# Patient Record
Sex: Male | Born: 1960
Health system: Southern US, Community
[De-identification: ages and names within clinical notes are randomized; demographics above are authoritative.]

## PROBLEM LIST (undated history)

## (undated) HISTORY — PX: KNEE SURGERY: SHX244

---

## 1998-03-20 ENCOUNTER — Encounter: Admission: RE | Admit: 1998-03-20 | Discharge: 1998-03-20 | Payer: Self-pay | Admitting: Sports Medicine

## 1999-09-14 ENCOUNTER — Emergency Department (HOSPITAL_COMMUNITY): Admission: EM | Admit: 1999-09-14 | Discharge: 1999-09-14 | Payer: Self-pay | Admitting: Emergency Medicine

## 2006-01-31 ENCOUNTER — Encounter (INDEPENDENT_AMBULATORY_CARE_PROVIDER_SITE_OTHER): Payer: Self-pay | Admitting: *Deleted

## 2006-01-31 ENCOUNTER — Ambulatory Visit (HOSPITAL_BASED_OUTPATIENT_CLINIC_OR_DEPARTMENT_OTHER): Admission: RE | Admit: 2006-01-31 | Discharge: 2006-01-31 | Payer: Self-pay | Admitting: General Surgery

## 2006-07-24 ENCOUNTER — Ambulatory Visit (HOSPITAL_COMMUNITY): Admission: RE | Admit: 2006-07-24 | Discharge: 2006-07-24 | Payer: Self-pay | Admitting: Orthopaedic Surgery

## 2006-07-30 ENCOUNTER — Ambulatory Visit (HOSPITAL_BASED_OUTPATIENT_CLINIC_OR_DEPARTMENT_OTHER): Admission: RE | Admit: 2006-07-30 | Discharge: 2006-07-30 | Payer: Self-pay | Admitting: Orthopaedic Surgery

## 2008-11-08 ENCOUNTER — Ambulatory Visit (HOSPITAL_BASED_OUTPATIENT_CLINIC_OR_DEPARTMENT_OTHER): Admission: RE | Admit: 2008-11-08 | Discharge: 2008-11-08 | Payer: Self-pay | Admitting: Orthopaedic Surgery

## 2010-06-24 ENCOUNTER — Encounter: Payer: Self-pay | Admitting: *Deleted

## 2010-09-10 LAB — POCT HEMOGLOBIN-HEMACUE: Hemoglobin: 13.8 g/dL (ref 13.0–17.0)
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
## 2010-10-19 NOTE — Op Note (Signed)
NAME:  Benjamin Webster, Benjamin Webster             ACCOUNT NO.:  [id number]   MEDICAL RECORD NO.:  [id number]          PATIENT TYPE:  AMB   LOCATION:  DSC                          FACILITY:  [facility]   PHYSICIAN:  Vanita Panda. Magnus Ivan, M.D.DATE OF BIRTH:  [birth year]   DATE OF PROCEDURE:  07/30/2006  DATE OF DISCHARGE:                               OPERATIVE REPORT   PREOPERATIVE DIAGNOSIS:  Left knee complex medial meniscal tear.   POSTOPERATIVE DIAGNOSIS:  1. Left knee complex posterior horn medial meniscal tear.  2. Grade 3 chondromalacia medial femoral condyle.   PROCEDURE:  1. Left knee arthroscopy with debridement.  2. Left knee partial medial meniscectomy.   SURGEON:  Vanita Panda. Magnus Ivan, M.D.   ANESTHESIA:  1. Local knee block.  2. Mask ventilation with IV sedation.   BLOOD LOSS:  Minimal.   COMPLICATIONS:  None.   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  He is an avid runner and has been so for many years.  For over six weeks now, he developed left knee pain with an effusion and  medial sided joint line tenderness.  This was new to him and he had no  obvious injury, but again, he has years as an avid runner with many  miles run a week.  I saw him in the office and set him up for an MRI  scan. The MRI did come back with effusion of his knee as well as a  signal throughout the posterior horn of the medial meniscus into the mid  body suggesting a complex tear.  It was recommended that he undergo  arthroscopy with debridement and partial medial meniscectomy considering  the amount of synovitis in the knee and what this was doing for his knee  from a pain standpoint, as well.  The risks and benefits of this were  explained to him and well understood.  He agreed to proceed with  surgery.   PROCEDURE:  After informed consent was obtained the appropriate left leg  was marked, a knee block was obtained by anesthesia.  He was then  brought to the operating room and placed supine on the operating table.  The left leg was prepped and draped with DuraPrep and sterile drapes  including sterile stockinette.  A time-out was called and he was  identified as the correct patient and the correct extremity.  The bed  was raised and with a lateral leg post, his leg was flexed off the side  of the table.  Again, he had already been prepped and draped with  DuraPrep and sterile drapes and a sterile stockinette.  An anterolateral  portal was then made just off the lateral edge of the patellar tendon at  the level of the inferior pole of patella.  The arthroscope was inserted  and right away I went to the medial compartment and made a medial  incision for inserting a probe. Right away, I could see that there was a  cartilage defect on the articular surface of the medial femoral condyle  toward the notch. I probed  this and this was found to be at least grade  2 to grade 3 chondromalacia, but there was not exposed bone and margins  were at least free from any large fragment debris. With external  rotation and valgus stress on the knee, you could see a complex tear of  the posterior horn of the meniscus.  I then placed an arthroscopic  shaver into the knee as well as upbiters and side biters to perform a  partial medial meniscectomy.  Once the meniscus was debrided, I was able  to put a probe back in the knee and then the meniscus was found to have  some remnant of the posterior edge remaining with no free meniscal  fragment and no block or obstruction affecting the joint.  I did put the  knee through a range of motion with flexion and extension.  Next, the  ACL was probed and was found to be intact.  The lateral compartment was  assessed using the figure-of-four position.  The lateral meniscus, the  popliteus tendon, and the cartilage were all pristine and intact.  The  patellofemoral joint was also assessed and it was found to be  entirely  intact with no areas of cartilage deficit.  There was only some mild  synovitis in the knee, as well.  All instrumentation was removed and I  allowed fluid to lavage through the knee.  I then removed the fluid from  the knee and closed the portal sites with interrupted 4-0 nylon suture.  The knee was infiltrated with a morphine/Marcaine mixture.  Xeroform  followed by a well padded sterile dressing was applied.  The patient was  awakened and taken to the recovery room in stable condition.  There were  no complications.  All final counts were correct.   Postoperatively, I will have him rest his knee accordingly and  rehabilitated. I will have him start back on anti-inflammatories in a  few days postoperatively.  He will follow up in the office in two weeks.           ______________________________  Vanita Panda. Magnus Ivan, M.D.     CYB/MEDQ  D:  07/30/2006  T:  07/30/2006  Job:  [id number]

## 2012-03-02 ENCOUNTER — Ambulatory Visit (INDEPENDENT_AMBULATORY_CARE_PROVIDER_SITE_OTHER): Payer: Commercial Managed Care - PPO | Admitting: General Surgery

## 2012-03-02 DIAGNOSIS — D229 Melanocytic nevi, unspecified: Secondary | ICD-10-CM

## 2012-03-02 DIAGNOSIS — D239 Other benign neoplasm of skin, unspecified: Secondary | ICD-10-CM

## 2012-03-02 NOTE — Addendum Note (Signed)
Addended byLittie Deeds on: 03/02/2012 08:57 AM   Modules accepted: Orders

## 2012-03-02 NOTE — Progress Notes (Signed)
Subjective:     Patient ID: Benjamin Webster, male   DOB: [birth year], 51 y.o.   MRN: [id number]  HPI 51 year old healthy male who presents with a changing left arm nevus for biopsy.  Review of Systems     Objective:   Physical Exam Left forearm with a 2 mm black papule    Assessment:     Left forearm nevus    Plan:     We discussed a punch biopsy. I used a 6 mm punch biopsy. I cleansed this area. I then infiltrated lidocaine. I then did a 6 mm punch biopsy. I closed this with a 4-0 nylon suture. A dressing was placed. I will call him with his pathology results.

## 2012-03-12 ENCOUNTER — Other Ambulatory Visit: Payer: Self-pay

## 2014-01-06 ENCOUNTER — Emergency Department (HOSPITAL_COMMUNITY)
Admission: EM | Admit: 2014-01-06 | Discharge: 2014-01-06 | Disposition: A | Discharge status: Home / Self Care | Payer: 59 | Source: Home / Self Care | Attending: Family Medicine | Admitting: Family Medicine

## 2014-01-06 ENCOUNTER — Encounter (HOSPITAL_COMMUNITY): Payer: Self-pay | Admitting: Emergency Medicine

## 2014-01-06 DIAGNOSIS — J029 Acute pharyngitis, unspecified: Secondary | ICD-10-CM

## 2014-01-06 LAB — POCT RAPID STREP A: Streptococcus, Group A Screen (Direct): NEGATIVE

## 2014-01-06 MED ORDER — IPRATROPIUM BROMIDE 0.06 % NA SOLN: 2.0000 | Freq: Four times a day (QID) | NASAL | Status: DC

## 2014-01-06 NOTE — ED Provider Notes (Signed)
Benjamin Webster is a 53 y.o. male who presents to Urgent Care today for sore throat. Patient has a three-day history of sore throat associated with fatigue and a postnasal drip. He has tried some Alka-Seltzer which helps some. He has a mild cough as well. He denies any fevers chills nausea vomiting or diarrhea. He feels well otherwise.   History reviewed. No pertinent past medical history. History  Substance Use Topics  . Smoking status: Never Smoker   . Smokeless tobacco: Not on file  . Alcohol Use: No   ROS as above Medications: No current facility-administered medications for this encounter.   Current Outpatient Prescriptions  Medication Sig Dispense Refill  . ipratropium (ATROVENT) 0.06 % nasal spray Place 2 sprays into both nostrils 4 (four) times daily.  15 mL  1    Exam:  BP 128/80  Pulse 97  Temp(Src) 98.4 F (36.9 C) (Oral)  Resp 18  SpO2 100% Gen: Well NAD HEENT: EOMI,  MMM cobblestoning posterior pharynx. Tympanic membranes are normal appearing bilaterally. Lungs: Normal work of breathing. CTABL Heart: RRR no MRG Abd: NABS, Soft. Nondistended, Nontender Exts: Brisk capillary refill, warm and well perfused.   Results for orders placed during the hospital encounter of 01/06/14 (from the past 24 hour(s))  POCT RAPID STREP A ([place])     Status: None   Collection Time    01/06/14  9:15 AM      Result Value Ref Range   Streptococcus, Group A Screen (Direct) NEGATIVE  NEGATIVE   No results found.  Assessment and Plan: 53 y.o. male with pharyngitis. Likely viral. Strep culture pending. Treatment with Tylenol or ibuprofen as well as Atrovent nasal spray.  Discussed warning signs or symptoms. Please see discharge instructions. Patient expresses understanding.   This note was created using Systems analyst. Any transcription errors are unintended.    Gregor Hams, MD 01/06/14 [phone]

## 2014-01-06 NOTE — ED Notes (Signed)
C/o  Sore throat x 3 days.  Afebrile.  Denies any other symptoms.

## 2014-01-06 NOTE — Discharge Instructions (Signed)
Thank you for coming in today. °Call or go to the emergency room if you get worse, have trouble breathing, have chest pains, or palpitations.  ° °Pharyngitis °Pharyngitis is redness, pain, and swelling (inflammation) of your pharynx.  °CAUSES  °Pharyngitis is usually caused by infection. Most of the time, these infections are from viruses (viral) and are part of a cold. However, sometimes pharyngitis is caused by bacteria (bacterial). Pharyngitis can also be caused by allergies. Viral pharyngitis may be spread from person to person by coughing, sneezing, and personal items or utensils (cups, forks, spoons, toothbrushes). Bacterial pharyngitis may be spread from person to person by more intimate contact, such as kissing.  °SIGNS AND SYMPTOMS  °Symptoms of pharyngitis include:   °· Sore throat.   °· Tiredness (fatigue).   °· Low-grade fever.   °· Headache. °· Joint pain and muscle aches. °· Skin rashes. °· Swollen lymph nodes. °· Plaque-like film on throat or tonsils (often seen with bacterial pharyngitis). °DIAGNOSIS  °Your health care provider will ask you questions about your illness and your symptoms. Your medical history, along with a physical exam, is often all that is needed to diagnose pharyngitis. Sometimes, a rapid strep test is done. Other lab tests may also be done, depending on the suspected cause.  °TREATMENT  °Viral pharyngitis will usually get better in 3-4 days without the use of medicine. Bacterial pharyngitis is treated with medicines that kill germs (antibiotics).  °HOME CARE INSTRUCTIONS  °· Drink enough water and fluids to keep your urine clear or pale yellow.   °· Only take over-the-counter or prescription medicines as directed by your health care provider:   °¨ If you are prescribed antibiotics, make sure you finish them even if you start to feel better.   °¨ Do not take aspirin.   °· Get lots of rest.   °· Gargle with 8 oz of salt water (½ tsp of salt per 1 qt of water) as often as every 1-2  hours to soothe your throat.   °· Throat lozenges (if you are not at risk for choking) or sprays may be used to soothe your throat. °SEEK MEDICAL CARE IF:  °· You have large, tender lumps in your neck. °· You have a rash. °· You cough up green, yellow-brown, or bloody spit. °SEEK IMMEDIATE MEDICAL CARE IF:  °· Your neck becomes stiff. °· You drool or are unable to swallow liquids. °· You vomit or are unable to keep medicines or liquids down. °· You have severe pain that does not go away with the use of recommended medicines. °· You have trouble breathing (not caused by a stuffy nose). °MAKE SURE YOU:  °· Understand these instructions. °· Will watch your condition. °· Will get help right away if you are not doing well or get worse. °Document Released: 05/20/2005 Document Revised: 03/10/2013 Document Reviewed: 01/25/2013 °[employer]® Patient Information ©2015 [employer]. This information is not intended to replace advice given to you by your health care provider. Make sure you discuss any questions you have with your health care provider. ° ° ° °

## 2014-01-08 LAB — CULTURE, GROUP A STREP

## 2015-01-11 ENCOUNTER — Telehealth: Payer: Self-pay | Admitting: Family Medicine

## 2015-01-11 NOTE — Telephone Encounter (Signed)
Lm on vm to cb and OK to sch appt w/ dr Elease Hashimoto

## 2015-01-12 NOTE — Telephone Encounter (Signed)
Pt has been scheduled.  °

## 2015-02-09 ENCOUNTER — Ambulatory Visit (INDEPENDENT_AMBULATORY_CARE_PROVIDER_SITE_OTHER): Payer: 59 | Admitting: Family Medicine

## 2015-02-09 ENCOUNTER — Encounter: Payer: Self-pay | Admitting: Family Medicine

## 2015-02-09 VITALS — BP 122/84 | HR 95 | Temp 97.9°F | Ht 67.72 in | Wt 167.0 lb

## 2015-02-09 DIAGNOSIS — Z0000 Encounter for general adult medical examination without abnormal findings: Secondary | ICD-10-CM | POA: Diagnosis not present

## 2015-02-09 DIAGNOSIS — Z23 Encounter for immunization: Secondary | ICD-10-CM

## 2015-02-09 NOTE — Progress Notes (Signed)
   Subjective:    Patient ID: Benjamin Webster, male    DOB: [birth year], 54 y.o.   MRN: [id number]  HPI Patient seen to establish care and for CPE. He has not seen a regular physician in several years. Generally very healthy. He takes no regular medications. He has not had screening colonoscopy. No flu vaccine yet. Nonsmoker. Works as a Music therapist. Exercises somewhat inconsistently. Married with 4 children.    History reviewed. No pertinent past medical history. Past Surgical History  Procedure Laterality Date  . Knee surgery      reports that he has never smoked. He does not have any smokeless tobacco history on file. He reports that he does not drink alcohol or use illicit drugs. family history includes Cancer in his mother; Heart disease in his father; Hyperlipidemia in his mother; Hypertension in his mother. No Known Allergies    Review of Systems  Constitutional: Negative for fever, activity change, appetite change and fatigue.  HENT: Negative for congestion, ear pain and trouble swallowing.   Eyes: Negative for pain and visual disturbance.  Respiratory: Negative for cough, shortness of breath and wheezing.   Cardiovascular: Negative for chest pain and palpitations.  Gastrointestinal: Negative for nausea, vomiting, abdominal pain, diarrhea, constipation, blood in stool, abdominal distention and rectal pain.  Genitourinary: Negative for dysuria, hematuria and testicular pain.  Musculoskeletal: Negative for joint swelling and arthralgias.  Skin: Negative for rash.  Neurological: Negative for dizziness, syncope and headaches.  Hematological: Negative for adenopathy.  Psychiatric/Behavioral: Negative for confusion and dysphoric mood.       Objective:   Physical Exam  Constitutional: He is oriented to person, place, and time. He appears well-developed and well-nourished. No distress.  HENT:  Head: Normocephalic and atraumatic.  Right Ear: External ear normal.    Left Ear: External ear normal.  Mouth/Throat: Oropharynx is clear and moist.  Eyes: Conjunctivae and EOM are normal. Pupils are equal, round, and reactive to light.  Neck: Normal range of motion. Neck supple. No thyromegaly present.  Cardiovascular: Normal rate, regular rhythm and normal heart sounds.   No murmur heard. Pulmonary/Chest: No respiratory distress. He has no wheezes. He has no rales.  Abdominal: Soft. Bowel sounds are normal. He exhibits no distension and no mass. There is no tenderness. There is no rebound and no guarding.  Genitourinary: Rectum normal and prostate normal.  Musculoskeletal: He exhibits no edema.  Lymphadenopathy:    He has no cervical adenopathy.  Neurological: He is alert and oriented to person, place, and time. He displays normal reflexes. No cranial nerve deficit.  Skin: No rash noted.  Psychiatric: He has a normal mood and affect.          Assessment & Plan:  Physical exam. Schedule screening colonoscopy. Flu vaccine given. Schedule screening lab work. Confirm date of last tetanus.

## 2015-02-09 NOTE — Progress Notes (Signed)
Pre visit review using our clinic review tool, if applicable. No additional management support is needed unless otherwise documented below in the visit note. 

## 2015-02-10 LAB — BASIC METABOLIC PANEL
BUN: 16 mg/dL (ref 6–23)
CHLORIDE: 103 meq/L (ref 96–112)
CO2: 28 mEq/L (ref 19–32)
CREATININE: 1.01 mg/dL (ref 0.40–1.50)
Calcium: 9.5 mg/dL (ref 8.4–10.5)
GFR: 81.1 mL/min (ref >60.00)
Glucose, Bld: 74 mg/dL (ref 70–99)
Potassium: 4.6 mEq/L (ref 3.5–5.1)
SODIUM: 140 meq/L (ref 135–145)

## 2015-02-10 LAB — PSA: PSA: 0.55 ng/mL (ref 0.10–4.00)

## 2015-02-10 LAB — CBC WITH DIFFERENTIAL/PLATELET
Basophils Absolute: 0.1 10*3/uL (ref 0.0–0.1)
Basophils Relative: 1 % (ref 0.0–3.0)
EOS ABS: 0.2 10*3/uL (ref 0.0–0.7)
Eosinophils Relative: 2.7 % (ref 0.0–5.0)
HCT: 47 % (ref 39.0–52.0)
Hemoglobin: 16.3 g/dL (ref 13.0–17.0)
Lymphocytes Relative: 26.6 % (ref 12.0–46.0)
Lymphs Abs: 2.2 10*3/uL (ref 0.7–4.0)
MCHC: 34.7 g/dL (ref 30.0–36.0)
MCV: 87.1 fl (ref 78.0–100.0)
MONO ABS: 0.6 10*3/uL (ref 0.1–1.0)
Monocytes Relative: 7.3 % (ref 3.0–12.0)
NEUTROS ABS: 5.1 10*3/uL (ref 1.4–7.7)
Neutrophils Relative %: 62.4 % (ref 43.0–77.0)
PLATELETS: 266 10*3/uL (ref 150.0–400.0)
RBC: 5.4 Mil/uL (ref 4.22–5.81)
RDW: 13.3 % (ref 11.5–15.5)
WBC: 8.1 10*3/uL (ref 4.0–10.5)

## 2015-02-10 LAB — HEPATIC FUNCTION PANEL
ALT: 29 U/L (ref 0–53)
AST: 23 U/L (ref 0–37)
Albumin: 4.5 g/dL (ref 3.5–5.2)
Alkaline Phosphatase: 56 U/L (ref 39–117)
Bilirubin, Direct: 0.1 mg/dL (ref 0.0–0.3)
TOTAL PROTEIN: 6.8 g/dL (ref 6.0–8.3)
Total Bilirubin: 0.7 mg/dL (ref 0.2–1.2)

## 2015-02-10 LAB — LIPID PANEL
CHOL/HDL RATIO: 5
Cholesterol: 205 mg/dL — ABNORMAL HIGH (ref 0–200)
HDL: 38.1 mg/dL — AB (ref >39.00)
LDL CALC: 140 mg/dL — AB (ref 0–99)
NonHDL: 167.27
TRIGLYCERIDES: 134 mg/dL (ref 0.0–149.0)
VLDL: 26.8 mg/dL (ref 0.0–40.0)

## 2015-02-10 LAB — TSH: TSH: 3.69 u[IU]/mL (ref 0.35–4.50)

## 2015-02-13 ENCOUNTER — Encounter: Payer: Self-pay | Admitting: Family Medicine

## 2015-05-05 ENCOUNTER — Telehealth: Payer: Self-pay | Admitting: Family Medicine

## 2015-05-05 MED ORDER — AZITHROMYCIN 250 MG PO TABS: ORAL_TABLET | ORAL | Status: AC

## 2015-05-05 NOTE — Telephone Encounter (Signed)
Pt c/o progressive sinus congestion, colored nasal discharge, malaise, not improved with OTC meds for several days.  Agreed to calling in Z-max and if no better in one week needs office follow up.  He has not had any recent antibiotics and states only about 4 times in his life.  No fever. No stiff neck.

## 2016-03-21 DIAGNOSIS — L821 Other seborrheic keratosis: Secondary | ICD-10-CM | POA: Diagnosis not present

## 2016-03-21 DIAGNOSIS — B351 Tinea unguium: Secondary | ICD-10-CM | POA: Diagnosis not present

## 2016-03-21 DIAGNOSIS — D225 Melanocytic nevi of trunk: Secondary | ICD-10-CM | POA: Diagnosis not present

## 2016-03-21 DIAGNOSIS — D1801 Hemangioma of skin and subcutaneous tissue: Secondary | ICD-10-CM | POA: Diagnosis not present

## 2016-03-21 DIAGNOSIS — D2271 Melanocytic nevi of right lower limb, including hip: Secondary | ICD-10-CM | POA: Diagnosis not present

## 2016-03-21 DIAGNOSIS — L814 Other melanin hyperpigmentation: Secondary | ICD-10-CM | POA: Diagnosis not present

## 2016-03-21 DIAGNOSIS — D2262 Melanocytic nevi of left upper limb, including shoulder: Secondary | ICD-10-CM | POA: Diagnosis not present

## 2016-03-21 DIAGNOSIS — L918 Other hypertrophic disorders of the skin: Secondary | ICD-10-CM | POA: Diagnosis not present

## 2016-03-21 DIAGNOSIS — L570 Actinic keratosis: Secondary | ICD-10-CM | POA: Diagnosis not present

## 2016-04-11 ENCOUNTER — Other Ambulatory Visit: Payer: Self-pay | Admitting: General Surgery

## 2016-04-11 MED ORDER — AZITHROMYCIN 250 MG PO TABS: ORAL_TABLET | ORAL | 0 refills | Status: AC

## 2016-04-11 MED FILL — AZITHROMYCIN 250 MG TABLET: 250 | 5 days supply | Qty: 6 | Fill #0

## 2016-08-02 ENCOUNTER — Other Ambulatory Visit (HOSPITAL_COMMUNITY): Payer: Self-pay | Admitting: General Surgery

## 2016-08-02 DIAGNOSIS — K4090 Unilateral inguinal hernia, without obstruction or gangrene, not specified as recurrent: Secondary | ICD-10-CM

## 2016-08-07 ENCOUNTER — Ambulatory Visit (HOSPITAL_COMMUNITY)
Admission: RE | Admit: 2016-08-07 | Discharge: 2016-08-07 | Disposition: A | Discharge status: Home / Self Care | Payer: 59 | Source: Ambulatory Visit | Attending: General Surgery | Admitting: General Surgery

## 2016-08-07 DIAGNOSIS — I771 Stricture of artery: Secondary | ICD-10-CM | POA: Insufficient documentation

## 2016-08-07 DIAGNOSIS — K4090 Unilateral inguinal hernia, without obstruction or gangrene, not specified as recurrent: Secondary | ICD-10-CM | POA: Diagnosis not present

## 2016-08-07 DIAGNOSIS — R1031 Right lower quadrant pain: Secondary | ICD-10-CM | POA: Diagnosis present

## 2016-08-07 MED ORDER — IOPAMIDOL (ISOVUE-300) INJECTION 61%
INTRAVENOUS | Status: AC
  Administered 2016-08-07: 100 mL
  Filled 2016-08-07: qty 100

## 2016-10-04 MED FILL — AZITHROMYCIN 250 MG TABLET: 250 | 5 days supply | Qty: 6 | Fill #0

## 2016-12-12 ENCOUNTER — Telehealth: Payer: Self-pay | Admitting: Family Medicine

## 2016-12-12 MED ORDER — AZITHROMYCIN 250 MG PO TABS: ORAL_TABLET | ORAL | 0 refills | Status: AC

## 2016-12-12 MED FILL — AZITHROMYCIN 250 MG TABLET: 250 | 5 days supply | Qty: 6 | Fill #0

## 2016-12-12 NOTE — Telephone Encounter (Signed)
Pt has had progressive facial pain, malaise, sinus congestion for several days.  Getting ready to leave town for vacation.Marland Kitchen  He is requesting Z-pack.  I have recommend he give this another few days to see if clears.  If not, may start the antibiotic.

## 2017-02-20 ENCOUNTER — Encounter: Payer: Self-pay | Admitting: Family Medicine

## 2017-10-07 ENCOUNTER — Encounter: Payer: 59 | Admitting: Family Medicine

## 2017-10-13 ENCOUNTER — Ambulatory Visit (INDEPENDENT_AMBULATORY_CARE_PROVIDER_SITE_OTHER): Payer: 59 | Admitting: Family Medicine

## 2017-10-13 ENCOUNTER — Encounter: Payer: Self-pay | Admitting: Family Medicine

## 2017-10-13 VITALS — BP 120/80 | HR 79 | Temp 98.2°F | Ht 69.0 in | Wt 172.8 lb

## 2017-10-13 DIAGNOSIS — Z0000 Encounter for general adult medical examination without abnormal findings: Secondary | ICD-10-CM | POA: Diagnosis not present

## 2017-10-13 LAB — CBC WITH DIFFERENTIAL/PLATELET
BASOS PCT: 0.5 % (ref 0.0–3.0)
Basophils Absolute: 0 10*3/uL (ref 0.0–0.1)
EOS PCT: 2 % (ref 0.0–5.0)
Eosinophils Absolute: 0.1 10*3/uL (ref 0.0–0.7)
HCT: 45.5 % (ref 39.0–52.0)
Hemoglobin: 16.1 g/dL (ref 13.0–17.0)
LYMPHS PCT: 23.7 % (ref 12.0–46.0)
Lymphs Abs: 1.8 10*3/uL (ref 0.7–4.0)
MCHC: 35.3 g/dL (ref 30.0–36.0)
MCV: 86.1 fl (ref 78.0–100.0)
MONO ABS: 0.6 10*3/uL (ref 0.1–1.0)
Monocytes Relative: 8.5 % (ref 3.0–12.0)
Neutro Abs: 5 10*3/uL (ref 1.4–7.7)
Neutrophils Relative %: 65.3 % (ref 43.0–77.0)
Platelets: 267 10*3/uL (ref 150.0–400.0)
RBC: 5.29 Mil/uL (ref 4.22–5.81)
RDW: 12.9 % (ref 11.5–15.5)
WBC: 7.6 10*3/uL (ref 4.0–10.5)

## 2017-10-13 LAB — BASIC METABOLIC PANEL
BUN: 17 mg/dL (ref 6–23)
CHLORIDE: 101 meq/L (ref 96–112)
CO2: 29 mEq/L (ref 19–32)
Calcium: 9.5 mg/dL (ref 8.4–10.5)
Creatinine, Ser: 0.96 mg/dL (ref 0.40–1.50)
GFR: 85.19 mL/min (ref >60.00)
Glucose, Bld: 81 mg/dL (ref 70–99)
POTASSIUM: 4.4 meq/L (ref 3.5–5.1)
SODIUM: 137 meq/L (ref 135–145)

## 2017-10-13 LAB — TSH: TSH: 2.88 u[IU]/mL (ref 0.35–4.50)

## 2017-10-13 LAB — LIPID PANEL
CHOLESTEROL: 195 mg/dL (ref 0–200)
HDL: 36.5 mg/dL — ABNORMAL LOW (ref >39.00)
LDL CALC: 139 mg/dL — AB (ref 0–99)
NonHDL: 158.5
Total CHOL/HDL Ratio: 5
Triglycerides: 98 mg/dL (ref 0.0–149.0)
VLDL: 19.6 mg/dL (ref 0.0–40.0)

## 2017-10-13 LAB — HEPATIC FUNCTION PANEL
ALT: 17 U/L (ref 0–53)
AST: 17 U/L (ref 0–37)
Albumin: 4.4 g/dL (ref 3.5–5.2)
Alkaline Phosphatase: 57 U/L (ref 39–117)
BILIRUBIN DIRECT: 0.1 mg/dL (ref 0.0–0.3)
TOTAL PROTEIN: 6.9 g/dL (ref 6.0–8.3)
Total Bilirubin: 0.9 mg/dL (ref 0.2–1.2)

## 2017-10-13 LAB — PSA: PSA: 0.6 ng/mL (ref 0.10–4.00)

## 2017-10-13 NOTE — Patient Instructions (Signed)
Let me know if interested in pursuing Cologuard.

## 2017-10-13 NOTE — Progress Notes (Signed)
  Subjective:     Patient ID: Benjamin Webster, male   DOB: 10-29-1960, 57 y.o.   MRN: [id number]  HPI Patient seen for physical exam. Generally very healthy. Takes no regular medications. 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 Nonsmoker. Never had colon cancer screening. He is willing to consider cologuard at this time. No history of shingles vaccine. Last tetanus unknown. He is not sure if he's had 2 prior measles mumps rubella vaccines and apparently has no written documentation.  No specific complaints today. Generally feels well. Enjoys gardening.  No past medical history on file. Past Surgical History:  Procedure Laterality Date  . KNEE SURGERY      reports that he has never smoked. He has never used smokeless tobacco. He reports that he does not drink alcohol or use drugs. family history includes Cancer in his mother; Heart disease in his father; Hyperlipidemia in his mother; Hypertension in his mother. No Known Allergies   Review of Systems  Constitutional: Negative for activity change, appetite change, fatigue and fever.  HENT: Negative for congestion, ear pain and trouble swallowing.   Eyes: Negative for pain and visual disturbance.  Respiratory: Negative for cough, shortness of breath and wheezing.   Cardiovascular: Negative for chest pain and palpitations.  Gastrointestinal: Negative for abdominal distention, abdominal pain, blood in stool, constipation, diarrhea, nausea, rectal pain and vomiting.  Genitourinary: Negative for dysuria, hematuria and testicular pain.  Musculoskeletal: Negative for arthralgias and joint swelling.  Skin: Negative for rash.  Neurological: Negative for dizziness, syncope and headaches.  Hematological: Negative for adenopathy.  Psychiatric/Behavioral: Negative for confusion and dysphoric mood.       Objective:   Physical Exam  Constitutional: He is oriented to person, place, and time. He appears well-developed  and well-nourished. No distress.  HENT:  Head: Normocephalic and atraumatic.  Right Ear: External ear normal.  Left Ear: External ear normal.  Mouth/Throat: Oropharynx is clear and moist.  Eyes: Pupils are equal, round, and reactive to light. Conjunctivae and EOM are normal.  Neck: Normal range of motion. Neck supple. No thyromegaly present.  Cardiovascular: Normal rate, regular rhythm and normal heart sounds.  No murmur heard. Pulmonary/Chest: No respiratory distress. He has no wheezes. He has no rales.  Abdominal: Soft. Bowel sounds are normal. He exhibits no distension and no mass. There is no tenderness. There is no rebound and no guarding.  Musculoskeletal: He exhibits no edema.  Lymphadenopathy:    He has no cervical adenopathy.  Neurological: He is alert and oriented to person, place, and time. He displays normal reflexes. No cranial nerve deficit.  Skin: No rash noted.  Psychiatric: He has a normal mood and affect.       Assessment:     Physical exam. Following issues were addressed    Plan:     -We've recommended colon cancer screening. He is somewhat reluctant to schedule colonoscopy this point but is interested in cologuard. He will check on insurance coverage and let us know if interested -Obtain screening lab work. Will include rubeola IgG to confirm measles vaccination status -Discussed tetanus vaccine and shingles vaccine and he is not interested in those at this time  Eulas Post MD [facility]

## 2017-10-14 ENCOUNTER — Encounter: Payer: Self-pay | Admitting: Family Medicine

## 2017-10-14 LAB — RUBEOLA ANTIBODY IGG

## 2017-10-30 ENCOUNTER — Telehealth: Payer: Self-pay | Admitting: *Deleted

## 2017-10-30 NOTE — Telephone Encounter (Signed)
-----  Message from Eulas Post, MD sent at 10/28/2017  6:09 PM EDT ----- Benjamin Webster,  Can you let him know his labs look good and he is measles immune- so no need to repeat MMR.  Other labs all stable.

## 2017-10-30 NOTE — Telephone Encounter (Signed)
Patient is aware of results.

## 2019-04-21 DIAGNOSIS — H5203 Hypermetropia, bilateral: Secondary | ICD-10-CM | POA: Diagnosis not present

## 2019-09-27 ENCOUNTER — Other Ambulatory Visit (INDEPENDENT_AMBULATORY_CARE_PROVIDER_SITE_OTHER): Payer: 59 | Admitting: Plastic Surgery

## 2019-09-27 DIAGNOSIS — L719 Rosacea, unspecified: Secondary | ICD-10-CM

## 2019-09-27 MED ORDER — BRIMONIDINE TARTRATE 0.33 % EX GEL: 1.0000 "application " | Freq: Every day | CUTANEOUS | 1 refills | Status: DC

## 2019-09-27 MED FILL — MIRVASO 0.33% GEL PUMP: 0.33 | 30 days supply | Qty: 30 | Fill #0

## 2019-09-27 NOTE — Progress Notes (Signed)
   Referring Provider No referring provider defined for this encounter.   CC: No chief complaint on file. Facial rosacea  Benjamin Webster is an 59 y.o. male.  HPI: Patient presents with complaints of persistent facial erythema related to his rosacea.  It is in the central glabellar and forehead area.  He has tried topical steroids with no relief.  He is interested in treatment for the erythema.  No Known Allergies  Outpatient Encounter Medications as of 09/27/2019  Medication Sig  . Brimonidine Tartrate 0.33 % GEL Apply 1 application topically daily.   No facility-administered encounter medications on file as of 09/27/2019.     Review of Systems General: Denies fevers and chills  Physical Exam Vitals with BMI 10/13/2017 02/09/2015 01/06/2014  Height 5\' 9"  5' 7.717" -  Weight 172 lbs 13 oz 167 lbs -  BMI [id number] -  Systolic 123456 [id number]  Diastolic 80 84 80  Pulse 79 95 97    General:  No acute distress,  Alert and oriented, Non-Toxic, Normal speech and affect HEENT: Normocephalic atraumatic.  Extraocular wounds intact.  He has limited facial erythema in the glabellar and central forehead area.  Is mostly telangiectasias and I do not see any pustular lesions.  Assessment/Plan Patient presents with persistent facial erythema related to what looks like rosacea.  We will plan a topical brimonidine for this and see if that provides adequate relief.  If not we can entertain alternative topical treatments or potentially laser therapy.  Cindra Presume 09/27/2019, 1:53 PM

## 2019-12-01 ENCOUNTER — Encounter: Payer: Self-pay | Admitting: Family Medicine

## 2019-12-01 ENCOUNTER — Ambulatory Visit (INDEPENDENT_AMBULATORY_CARE_PROVIDER_SITE_OTHER): Payer: 59 | Admitting: Family Medicine

## 2019-12-01 ENCOUNTER — Other Ambulatory Visit: Payer: Self-pay

## 2019-12-01 VITALS — BP 122/68 | HR 86 | Temp 97.9°F | Ht 69.0 in | Wt 170.3 lb

## 2019-12-01 DIAGNOSIS — Z0000 Encounter for general adult medical examination without abnormal findings: Secondary | ICD-10-CM

## 2019-12-01 DIAGNOSIS — Z8249 Family history of ischemic heart disease and other diseases of the circulatory system: Secondary | ICD-10-CM | POA: Diagnosis not present

## 2019-12-01 LAB — LIPID PANEL
Cholesterol: 190 mg/dL (ref 0–200)
HDL: 34.3 mg/dL — ABNORMAL LOW (ref >39.00)
LDL Cholesterol: 132 mg/dL — ABNORMAL HIGH (ref 0–99)
NonHDL: 155.83
Total CHOL/HDL Ratio: 6
Triglycerides: 121 mg/dL (ref 0.0–149.0)
VLDL: 24.2 mg/dL (ref 0.0–40.0)

## 2019-12-01 LAB — HEPATIC FUNCTION PANEL
ALT: 18 U/L (ref 0–53)
AST: 19 U/L (ref 0–37)
Albumin: 4.5 g/dL (ref 3.5–5.2)
Alkaline Phosphatase: 58 U/L (ref 39–117)
Bilirubin, Direct: 0.1 mg/dL (ref 0.0–0.3)
Total Bilirubin: 0.7 mg/dL (ref 0.2–1.2)
Total Protein: 6.5 g/dL (ref 6.0–8.3)

## 2019-12-01 LAB — CBC WITH DIFFERENTIAL/PLATELET
Basophils Absolute: 0 10*3/uL (ref 0.0–0.1)
Basophils Relative: 0.7 % (ref 0.0–3.0)
Eosinophils Absolute: 0.1 10*3/uL (ref 0.0–0.7)
Eosinophils Relative: 1.5 % (ref 0.0–5.0)
HCT: 43.6 % (ref 39.0–52.0)
Hemoglobin: 15.3 g/dL (ref 13.0–17.0)
Lymphocytes Relative: 20.6 % (ref 12.0–46.0)
Lymphs Abs: 1.4 10*3/uL (ref 0.7–4.0)
MCHC: 35.1 g/dL (ref 30.0–36.0)
MCV: 87 fl (ref 78.0–100.0)
Monocytes Absolute: 0.6 10*3/uL (ref 0.1–1.0)
Monocytes Relative: 8.5 % (ref 3.0–12.0)
Neutro Abs: 4.5 10*3/uL (ref 1.4–7.7)
Neutrophils Relative %: 68.7 % (ref 43.0–77.0)
Platelets: 261 10*3/uL (ref 150.0–400.0)
RBC: 5.01 Mil/uL (ref 4.22–5.81)
RDW: 13 % (ref 11.5–15.5)
WBC: 6.6 10*3/uL (ref 4.0–10.5)

## 2019-12-01 LAB — BASIC METABOLIC PANEL
BUN: 15 mg/dL (ref 6–23)
CO2: 26 mEq/L (ref 19–32)
Calcium: 9.4 mg/dL (ref 8.4–10.5)
Chloride: 102 mEq/L (ref 96–112)
Creatinine, Ser: 0.91 mg/dL (ref 0.40–1.50)
GFR: 84.64 mL/min (ref >60.00)
Glucose, Bld: 79 mg/dL (ref 70–99)
Potassium: 4 mEq/L (ref 3.5–5.1)
Sodium: 137 mEq/L (ref 135–145)

## 2019-12-01 LAB — TSH: TSH: 3.11 u[IU]/mL (ref 0.35–4.50)

## 2019-12-01 LAB — PSA: PSA: 0.42 ng/mL (ref 0.10–4.00)

## 2019-12-01 NOTE — Progress Notes (Signed)
Established Patient Office Visit  Subjective:  Patient ID: Benjamin Webster, male    DOB: Nov 27, 1960  Age: 59 y.o. MRN: [id number]  CC:  Chief Complaint  Patient presents with   Annual Exam    pt has no new concerns     HPI Benjamin Webster presents for physical exam.  Generally very healthy.  Takes no medications.  No prior surgeries.  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  He is married and has 4 children.  His oldest is 25 years old.  Stays busy with soccer with his kids.  No history of colon cancer screening.  He would like to consider Cologuard.  He thinks his last tetanus was in the past 10 years.  He has had Covid vaccine.  Declines shingles vaccine  Family history reviewed.  His father had CAD issues around age 12 and died age 70 presumably of acute MI.  His father was a smoker.  No past medical history on file.  Past Surgical History:  Procedure Laterality Date   KNEE SURGERY      Family History  Problem Relation Age of Onset   Cancer Mother        Lung   Hyperlipidemia Mother    Hypertension Mother    Heart disease Father     Social History   Socioeconomic History   Marital status: Married    Spouse name: Not on file   Number of children: Not on file   Years of education: Not on file   Highest education level: Not on file  Occupational History   Not on file  Tobacco Use   Smoking status: Never Smoker   Smokeless tobacco: Never Used  Substance and Sexual Activity   Alcohol use: No   Drug use: No   Sexual activity: Yes  Other Topics Concern   Not on file  Social History Narrative   Not on file   Social Determinants of Health   Financial Resource Strain:    Difficulty of Paying Living Expenses:   Food Insecurity:    Worried About Charity fundraiser in the Last Year:    Arboriculturist in the Last Year:   Transportation Needs:    Film/video editor (Medical):    Lack of Transportation  (Non-Medical):   Physical Activity:    Days of Exercise per Week:    Minutes of Exercise per Session:   Stress:    Feeling of Stress :   Social Connections:    Frequency of Communication with Friends and Family:    Frequency of Social Gatherings with Friends and Family:    Attends Religious Services:    Active Member of Clubs or Organizations:    Attends Music therapist:    Marital Status:   Intimate Partner Violence:    Fear of Current or Ex-Partner:    Emotionally Abused:    Physically Abused:    Sexually Abused:     Outpatient Medications Prior to Visit  Medication Sig Dispense Refill   Brimonidine Tartrate 0.33 % GEL Apply 1 application topically daily. 30 g 1   No facility-administered medications prior to visit.    No Known Allergies  ROS Review of Systems  Constitutional: Negative for activity change, appetite change, fatigue and fever.  HENT: Negative for congestion, ear pain and trouble swallowing.   Eyes: Negative for pain and visual disturbance.  Respiratory: Negative for cough, shortness of breath and wheezing.  Cardiovascular: Negative for chest pain and palpitations.  Gastrointestinal: Negative for abdominal distention, abdominal pain, blood in stool, constipation, diarrhea, nausea, rectal pain and vomiting.  Genitourinary: Negative for dysuria, hematuria and testicular pain.  Musculoskeletal: Negative for arthralgias and joint swelling.  Skin: Negative for rash.  Neurological: Negative for dizziness, syncope and headaches.  Hematological: Negative for adenopathy.  Psychiatric/Behavioral: Negative for confusion and dysphoric mood.      Objective:    Physical Exam Constitutional:      General: He is not in acute distress.    Appearance: He is well-developed.  HENT:     Head: Normocephalic and atraumatic.     Right Ear: External ear normal.     Ears:     Comments: Moderate cerumen left canal Eyes:      Conjunctiva/sclera: Conjunctivae normal.     Pupils: Pupils are equal, round, and reactive to light.  Neck:     Thyroid: No thyromegaly.  Cardiovascular:     Rate and Rhythm: Normal rate and regular rhythm.     Heart sounds: Normal heart sounds. No murmur heard.   Pulmonary:     Effort: No respiratory distress.     Breath sounds: No wheezing or rales.  Abdominal:     General: Bowel sounds are normal. There is no distension.     Palpations: Abdomen is soft. There is no mass.     Tenderness: There is no abdominal tenderness. There is no guarding or rebound.  Musculoskeletal:     Cervical back: Normal range of motion and neck supple.  Lymphadenopathy:     Cervical: No cervical adenopathy.  Skin:    Findings: No rash.  Neurological:     Mental Status: He is alert and oriented to person, place, and time.     Cranial Nerves: No cranial nerve deficit.     Deep Tendon Reflexes: Reflexes normal.     BP 122/68 (BP Location: Left Arm, Patient Position: Sitting, Cuff Size: Normal)    Pulse 86    Temp 97.9 F (36.6 C) (Temporal)    Ht 5\' 9"  (1.753 m)    Wt 170 lb 4.8 oz (77.2 kg)    SpO2 98%    BMI 25.15 kg/m  Wt Readings from Last 3 Encounters:  12/01/19 170 lb 4.8 oz (77.2 kg)  10/13/17 172 lb 12.8 oz (78.4 kg)  02/09/15 167 lb (75.8 kg)     Health Maintenance Due  Topic Date Due   HIV Screening  Never done   TETANUS/TDAP  Never done   COLONOSCOPY  Never done    There are no preventive care reminders to display for this patient.  Lab Results  Component Value Date   TSH 2.88 10/13/2017   Lab Results  Component Value Date   WBC 7.6 10/13/2017   HGB 16.1 10/13/2017   HCT 45.5 10/13/2017   MCV 86.1 10/13/2017   PLT 267.0 10/13/2017   Lab Results  Component Value Date   NA 137 10/13/2017   K 4.4 10/13/2017   CO2 29 10/13/2017   GLUCOSE 81 10/13/2017   BUN 17 10/13/2017   CREATININE 0.96 10/13/2017   BILITOT 0.9 10/13/2017   ALKPHOS 57 10/13/2017   AST 17  10/13/2017   ALT 17 10/13/2017   PROT 6.9 10/13/2017   ALBUMIN 4.4 10/13/2017   CALCIUM 9.5 10/13/2017   GFR 85.19 10/13/2017   Lab Results  Component Value Date   CHOL 195 10/13/2017   Lab Results  Component Value Date  HDL 36.50 (L) 10/13/2017   Lab Results  Component Value Date   LDLCALC 139 (H) 10/13/2017   Lab Results  Component Value Date   TRIG 98.0 10/13/2017   Lab Results  Component Value Date   CHOLHDL 5 10/13/2017   No results found for: HGBA1C    Assessment & Plan:   Physical exam.  He has positive family history of CAD in father as above.  No recent concerning symptoms.  We discussed the following health maintenance issues  -Offered shingles vaccine but he declines -Confirm date of last tetanus -Obtain screening labs including PSA -Continue with annual flu vaccine -Covid vaccine already given  No orders of the defined types were placed in this encounter.   Follow-up: No follow-ups on file.    Carolann Littler, MD

## 2019-12-01 NOTE — Patient Instructions (Addendum)
I will order Cologuard and set up referral to Dr Tamala Julian.  See if you can confirm date of last tetanus

## 2020-01-30 NOTE — Progress Notes (Deleted)
Cardiology Office Note:    Date:  01/30/2020   ID:  Benjamin Webster, DOB 09-13-1960, MRN [id number]  PCP:  Eulas Post, MD  Cardiologist:  No primary care provider on file.   Referring MD: Eulas Post, MD   No chief complaint on file.   History of Present Illness:    Benjamin Webster is a 59 y.o. male with no hx of CAD but has positive family history for CAD.  No past medical history on file.  Past Surgical History:  Procedure Laterality Date  . KNEE SURGERY      Current Medications: No outpatient medications have been marked as taking for the 02/02/20 encounter (Appointment) with Belva Crome, MD.     Allergies:   Patient has no known allergies.   Social History   Socioeconomic History  . Marital status: Married    Spouse name: Not on file  . Number of children: Not on file  . Years of education: Not on file  . Highest education level: Not on file  Occupational History  . Not on file  Tobacco Use  . Smoking status: Never Smoker  . Smokeless tobacco: Never Used  Substance and Sexual Activity  . Alcohol use: No  . Drug use: No  . Sexual activity: Yes  Other Topics Concern  . Not on file  Social History Narrative  . Not on file   Social Determinants of Health   Financial Resource Strain:   . Difficulty of Paying Living Expenses: Not on file  Food Insecurity:   . Worried About Charity fundraiser in the Last Year: Not on file  . Ran Out of Food in the Last Year: Not on file  Transportation Needs:   . Lack of Transportation (Medical): Not on file  . Lack of Transportation (Non-Medical): Not on file  Physical Activity:   . Days of Exercise per Week: Not on file  . Minutes of Exercise per Session: Not on file  Stress:   . Feeling of Stress : Not on file  Social Connections:   . Frequency of Communication with Friends and Family: Not on file  . Frequency of Social Gatherings with Friends and Family: Not on file  . Attends  Religious Services: Not on file  . Active Member of Clubs or Organizations: Not on file  . Attends Archivist Meetings: Not on file  . Marital Status: Not on file     Family History: The patient's family history includes Cancer in his mother; Heart disease in his father; Hyperlipidemia in his mother; Hypertension in his mother.  ROS:   Please see the history of present illness.    *** All other systems reviewed and are negative.  EKGs/Labs/Other Studies Reviewed:    The following studies were reviewed today: ***  EKG:  EKG ***  Recent Labs: 12/01/2019: ALT 18; BUN 15; Creatinine, Ser 0.91; Hemoglobin 15.3; Platelets 261.0; Potassium 4.0; Sodium 137; TSH 3.11  Recent Lipid Panel    Component Value Date/Time   CHOL 190 12/01/2019 1417   TRIG 121.0 12/01/2019 1417   HDL 34.30 (L) 12/01/2019 1417   CHOLHDL 6 12/01/2019 1417   VLDL 24.2 12/01/2019 1417   LDLCALC 132 (H) 12/01/2019 1417    Physical Exam:    VS:  There were no vitals taken for this visit.    Wt Readings from Last 3 Encounters:  12/01/19 170 lb 4.8 oz (77.2 kg)  10/13/17 172 lb 12.8 oz (78.4  kg)  02/09/15 167 lb (75.8 kg)     GEN: ***. No acute distress HEENT: Normal NECK: No JVD. LYMPHATICS: No lymphadenopathy CARDIAC: *** RRR without murmur, gallop, or edema. VASCULAR: *** Normal Pulses. No bruits. RESPIRATORY:  Clear to auscultation without rales, wheezing or rhonchi  ABDOMEN: Soft, non-tender, non-distended, No pulsatile mass, MUSCULOSKELETAL: No deformity  SKIN: Warm and dry NEUROLOGIC:  Alert and oriented x 3 PSYCHIATRIC:  Normal affect   ASSESSMENT:    1. Family history of early CAD   2. Hyperlipidemia LDL goal <70   3. Educated about COVID-19 virus infection    PLAN:    In order of problems listed above:  1. ***   Medication Adjustments/Labs and Tests Ordered: Current medicines are reviewed at length with the patient today.  Concerns regarding medicines are outlined  above.  No orders of the defined types were placed in this encounter.  No orders of the defined types were placed in this encounter.   There are no Patient Instructions on file for this visit.   Signed, Sinclair Grooms, MD  01/30/2020 1:09 PM    [place]

## 2020-02-02 ENCOUNTER — Ambulatory Visit: Payer: 59 | Admitting: Interventional Cardiology

## 2020-06-03 NOTE — Progress Notes (Deleted)
Cardiology Office Note:    Date:  06/03/2020   ID:  Benjamin Webster, DOB [birth year], MRN [id number]  PCP:  Kristian Covey, MD  Cardiologist:  No primary care provider on file.   Referring MD: Kristian Covey, MD   No chief complaint on file.   History of Present Illness:    Benjamin Webster is a 60 y.o. male with a hx of hyperlipidemia, family h/o MI (father 48yo), and abdominal aortic atherosclerosis.  ***  No past medical history on file.  Past Surgical History:  Procedure Laterality Date  . KNEE SURGERY      Current Medications: No outpatient medications have been marked as taking for the 06/07/20 encounter (Appointment) with Lyn Records, MD.     Allergies:   Patient has no known allergies.   Social History   Socioeconomic History  . Marital status: Married    Spouse name: Not on file  . Number of children: Not on file  . Years of education: Not on file  . Highest education level: Not on file  Occupational History  . Not on file  Tobacco Use  . Smoking status: Never Smoker  . Smokeless tobacco: Never Used  Substance and Sexual Activity  . Alcohol use: No  . Drug use: No  . Sexual activity: Yes  Other Topics Concern  . Not on file  Social History Narrative  . Not on file   Social Determinants of Health   Financial Resource Strain: Not on file  Food Insecurity: Not on file  Transportation Needs: Not on file  Physical Activity: Not on file  Stress: Not on file  Social Connections: Not on file     Family History: The patient's family history includes Cancer in his mother; Heart disease in his father; Hyperlipidemia in his mother; Hypertension in his mother.  ROS:   Please see the history of present illness.    *** All other systems reviewed and are negative.  EKGs/Labs/Other Studies Reviewed:    The following studies were reviewed today: ***  EKG:  EKG ***  Recent Labs: 12/01/2019: ALT 18; BUN 15; Creatinine, Ser 0.91;  Hemoglobin 15.3; Platelets 261.0; Potassium 4.0; Sodium 137; TSH 3.11  Recent Lipid Panel    Component Value Date/Time   CHOL 190 12/01/2019 1417   TRIG 121.0 12/01/2019 1417   HDL 34.30 (L) 12/01/2019 1417   CHOLHDL 6 12/01/2019 1417   VLDL 24.2 12/01/2019 1417   LDLCALC 132 (H) 12/01/2019 1417    Physical Exam:    VS:  There were no vitals taken for this visit.    Wt Readings from Last 3 Encounters:  12/01/19 170 lb 4.8 oz (77.2 kg)  10/13/17 172 lb 12.8 oz (78.4 kg)  02/09/15 167 lb (75.8 kg)     GEN: ***. No acute distress HEENT: Normal NECK: No JVD. LYMPHATICS: No lymphadenopathy CARDIAC: *** murmur. RRR *** gallop, or edema. VASCULAR: *** Normal Pulses. No bruits. RESPIRATORY:  Clear to auscultation without rales, wheezing or rhonchi  ABDOMEN: Soft, non-tender, non-distended, No pulsatile mass, MUSCULOSKELETAL: No deformity  SKIN: Warm and dry NEUROLOGIC:  Alert and oriented x 3 PSYCHIATRIC:  Normal affect   ASSESSMENT:    1. Hyperlipidemia LDL goal <70   2. Aortic atherosclerosis (HCC)   3. Family history of early CAD   4. Educated about COVID-19 virus infection    PLAN:    In order of problems listed above:  1. ***   Medication Adjustments/Labs and Tests  Ordered: Current medicines are reviewed at length with the patient today.  Concerns regarding medicines are outlined above.  No orders of the defined types were placed in this encounter.  No orders of the defined types were placed in this encounter.   There are no Patient Instructions on file for this visit.   Signed, Sinclair Grooms, MD  06/03/2020 4:45 PM    [facility]

## 2020-06-07 ENCOUNTER — Ambulatory Visit: Payer: 59 | Admitting: Interventional Cardiology

## 2020-06-07 DIAGNOSIS — Z7189 Other specified counseling: Secondary | ICD-10-CM

## 2020-06-07 DIAGNOSIS — E785 Hyperlipidemia, unspecified: Secondary | ICD-10-CM

## 2020-06-07 DIAGNOSIS — Z8249 Family history of ischemic heart disease and other diseases of the circulatory system: Secondary | ICD-10-CM

## 2020-06-07 DIAGNOSIS — I700 Atherosclerosis of aorta: Secondary | ICD-10-CM

## 2020-07-24 NOTE — Progress Notes (Signed)
Cardiology Office Note:    Date:  07/25/2020   ID:  Benjamin Webster, DOB 14-Oct-1960, MRN [id number]  PCP:  Benjamin Post, MD  Cardiologist:  No primary care provider on file.   Referring MD: Benjamin Post, MD   Chief Complaint  Patient presents with  . Hyperlipidemia    History of Present Illness:    Benjamin Webster is a 60 y.o. male with a hx of hyperlipidemia and family h/o CAD ( Benjamin Webster).  Benjamin Webster is doing well.  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  He has not had any cardiac issues.  25 years ago I care for his Benjamin Webster who is a smoker and had coronary disease requiring multiple stents and revascularization.  Other than the patient's Benjamin Webster, his other significant risk factors include age, and hyperlipidemia with LDL greater than 130.  He is on no therapy.  No history of diabetes mellitus.  Blood pressures tend to run less than 130/80.  He gets greater than 150 minutes of moderate activity per week.  History reviewed. No pertinent past medical history.  Past Surgical History:  Procedure Laterality Date  . KNEE SURGERY      Current Medications: No outpatient medications have been marked as taking for the 07/25/20 encounter (Office Visit) with Benjamin Crome, MD.     Allergies:   Patient has no known allergies.   Social History   Socioeconomic History  . Marital status: Married    Spouse name: Not on file  . Number of children: Not on file  . Years of education: Not on file  . Highest education level: Not on file  Occupational History  . Not on file  Tobacco Use  . Smoking status: Never Smoker  . Smokeless tobacco: Never Used  Substance and Sexual Activity  . Alcohol use: No  . Drug use: No  . Sexual activity: Yes  Other Topics Concern  . Not on file  Social History Narrative  . Not on file   Social Determinants of Health   Financial Resource Strain: Not on file  Food Insecurity: Not on file   Transportation Needs: Not on file  Physical Activity: Not on file  Stress: Not on file  Social Connections: Not on file     Family History: The patient's family history includes Cancer in his mother; Heart disease in his Benjamin Webster; Hyperlipidemia in his mother; Hypertension in his mother.  ROS:   Please see the history of present illness.    No new medical issues.  Has 4 children between the ages of 72 and 65 (3 daughters and son).  They all active in sports.  He has an older brother who does not have any vascular history all other systems reviewed and are negative.  EKGs/Labs/Other Studies Reviewed:    The following studies were reviewed today: No cardiac imaging or data  EKG: Normal sinus rhythm with normal appearance  Recent Labs: 12/01/2019: ALT 18; BUN 15; Creatinine, Ser 0.91; Hemoglobin 15.3; Platelets 261.0; Potassium 4.0; Sodium 137; TSH 3.11  Recent Lipid Panel    Component Value Date/Time   CHOL 190 12/01/2019 1417   TRIG 121.0 12/01/2019 1417   HDL 34.30 (L) 12/01/2019 1417   CHOLHDL 6 12/01/2019 1417   VLDL 24.2 12/01/2019 1417   LDLCALC 132 (H) 12/01/2019 1417    Physical Exam:    VS:  BP 132/74   Pulse 77   Ht 5\' 10"  (1.778 m)  Wt 178 lb 3.2 oz (80.8 kg)   SpO2 98%   BMI 25.57 kg/m     Wt Readings from Last 3 Encounters:  07/25/20 178 lb 3.2 oz (80.8 kg)  12/01/19 170 lb 4.8 oz (77.2 kg)  10/13/17 172 lb 12.8 oz (78.4 kg)     GEN: Healthy-appearing. No acute distress HEENT: Normal NECK: No JVD. LYMPHATICS: No lymphadenopathy CARDIAC: No murmur. RRR no gallop, or edema. VASCULAR:  Normal Pulses. No bruits. RESPIRATORY:  Clear to auscultation without rales, wheezing or rhonchi  ABDOMEN: Soft, non-tender, non-distended, No pulsatile mass, MUSCULOSKELETAL: No deformity  SKIN: Warm and dry NEUROLOGIC:  Alert and oriented x 3 PSYCHIATRIC:  Normal affect   ASSESSMENT:    1. Hyperlipidemia LDL goal <100   2. Family history of early CAD   3.  Educated about COVID-19 virus infection    PLAN:    In order of problems listed above:  1. LDL is elevated.  He has no other significant risk factors than the family history, sex, and age.  He is not excited about being on therapy for lipids.  We will do a coronary calcium score to further risk stratify.  We described the information that is obtainable from the noninvasive imaging of the coronaries.  He is willing to proceed. 2. Benjamin Webster was a smoker. 3. Healthy, vaccinated, boosted, and practicing mitigation.  Overall education and awareness concerning primary risk prevention was discussed in detail: LDL less than 70, hemoglobin A1c less than 7, blood pressure target less than 130/80 mmHg, >150 minutes of moderate aerobic activity per week, avoidance of smoking, weight control (via diet and exercise), and continued surveillance/management of/for obstructive sleep apnea.   Medication Adjustments/Labs and Tests Ordered: Current medicines are reviewed at length with the patient today.  Concerns regarding medicines are outlined above.  Orders Placed This Encounter  Procedures  . CT CARDIAC SCORING (SELF PAY ONLY)  . EKG 12-Lead   No orders of the defined types were placed in this encounter.   Patient Instructions  Medication Instructions:  Your physician recommends that you continue on your current medications as directed. Please refer to the Current Medication list given to you today.  *If you need a refill on your cardiac medications before your next appointment, please call your pharmacy*   Lab Work: None If you have labs (blood work) drawn today and your tests are completely normal, you will receive your results only by: Marland Kitchen MyChart Message (if you have MyChart) OR . A paper copy in the mail If you have any lab test that is abnormal or we need to change your treatment, we will call you to review the results.   Testing/Procedures: Your physician recommends that you have a Calcium  Score performed.    Follow-Up: At [facility], you and your health needs are our priority.  As part of our continuing mission to provide you with exceptional heart care, we have created designated Provider Care Teams.  These Care Teams include your primary Cardiologist (physician) and Advanced Practice Providers (APPs -  Physician Assistants and Nurse Practitioners) who all work together to provide you with the care you need, when you need it.  We recommend signing up for the patient portal called "MyChart".  Sign up information is provided on this After Visit Summary.  MyChart is used to connect with patients for Virtual Visits (Telemedicine).  Patients are able to view lab/test results, encounter notes, upcoming appointments, etc.  Non-urgent messages can be sent to your  provider as well.   To learn more about what you can do with MyChart, go to [email or web address].    Your next appointment:   As needed  The format for your next appointment:   In Person  Provider:   You may see Daneen Schick, MD or one of the following Advanced Practice Providers on your designated Care Team:    Kathyrn Drown, NP    Other Instructions      Signed, Sinclair Grooms, MD  07/25/2020 2:31 PM    [place]

## 2020-07-25 ENCOUNTER — Other Ambulatory Visit: Payer: Self-pay

## 2020-07-25 ENCOUNTER — Ambulatory Visit: Payer: 59 | Admitting: Interventional Cardiology

## 2020-07-25 ENCOUNTER — Encounter: Payer: Self-pay | Admitting: Interventional Cardiology

## 2020-07-25 VITALS — BP 132/74 | HR 77 | Ht 70.0 in | Wt 178.2 lb

## 2020-07-25 DIAGNOSIS — E785 Hyperlipidemia, unspecified: Secondary | ICD-10-CM

## 2020-07-25 DIAGNOSIS — Z8249 Family history of ischemic heart disease and other diseases of the circulatory system: Secondary | ICD-10-CM | POA: Diagnosis not present

## 2020-07-25 DIAGNOSIS — Z7189 Other specified counseling: Secondary | ICD-10-CM

## 2020-07-25 NOTE — Patient Instructions (Signed)
Medication Instructions:  Your physician recommends that you continue on your current medications as directed. Please refer to the Current Medication list given to you today.  *If you need a refill on your cardiac medications before your next appointment, please call your pharmacy*   Lab Work: None If you have labs (blood work) drawn today and your tests are completely normal, you will receive your results only by: Marland Kitchen MyChart Message (if you have MyChart) OR . A paper copy in the mail If you have any lab test that is abnormal or we need to change your treatment, we will call you to review the results.   Testing/Procedures: Your physician recommends that you have a Calcium Score performed.    Follow-Up: At [facility], you and your health needs are our priority.  As part of our continuing mission to provide you with exceptional heart care, we have created designated Provider Care Teams.  These Care Teams include your primary Cardiologist (physician) and Advanced Practice Providers (APPs -  Physician Assistants and Nurse Practitioners) who all work together to provide you with the care you need, when you need it.  We recommend signing up for the patient portal called "MyChart".  Sign up information is provided on this After Visit Summary.  MyChart is used to connect with patients for Virtual Visits (Telemedicine).  Patients are able to view lab/test results, encounter notes, upcoming appointments, etc.  Non-urgent messages can be sent to your provider as well.   To learn more about what you can do with MyChart, go to [email or web address].    Your next appointment:   As needed  The format for your next appointment:   In Person  Provider:   You may see Daneen Schick, MD or one of the following Advanced Practice Providers on your designated Care Team:    Kathyrn Drown, NP    Other Instructions

## 2020-08-17 ENCOUNTER — Other Ambulatory Visit: Payer: Self-pay

## 2020-08-17 ENCOUNTER — Ambulatory Visit (INDEPENDENT_AMBULATORY_CARE_PROVIDER_SITE_OTHER)
Admission: RE | Admit: 2020-08-17 | Discharge: 2020-08-17 | Disposition: A | Discharge status: Home / Self Care | Payer: Self-pay | Source: Ambulatory Visit | Attending: Interventional Cardiology | Admitting: Interventional Cardiology

## 2020-08-17 DIAGNOSIS — Z8249 Family history of ischemic heart disease and other diseases of the circulatory system: Secondary | ICD-10-CM

## 2020-08-17 DIAGNOSIS — E785 Hyperlipidemia, unspecified: Secondary | ICD-10-CM

## 2021-01-04 DIAGNOSIS — H5203 Hypermetropia, bilateral: Secondary | ICD-10-CM | POA: Diagnosis not present

## 2021-04-09 ENCOUNTER — Other Ambulatory Visit: Payer: Self-pay

## 2021-04-09 ENCOUNTER — Ambulatory Visit (INDEPENDENT_AMBULATORY_CARE_PROVIDER_SITE_OTHER): Payer: 59 | Admitting: Family Medicine

## 2021-04-09 ENCOUNTER — Encounter: Payer: Self-pay | Admitting: Family Medicine

## 2021-04-09 VITALS — BP 124/70 | HR 75 | Temp 98.0°F | Wt 176.2 lb

## 2021-04-09 DIAGNOSIS — Z1211 Encounter for screening for malignant neoplasm of colon: Secondary | ICD-10-CM | POA: Diagnosis not present

## 2021-04-09 DIAGNOSIS — Z0000 Encounter for general adult medical examination without abnormal findings: Secondary | ICD-10-CM | POA: Diagnosis not present

## 2021-04-09 NOTE — Progress Notes (Signed)
Established Patient Office Visit  Subjective:  Patient ID: Benjamin Webster, male    DOB: 04/11/1961  Age: 60 y.o. MRN: [id number]  CC:  Chief Complaint  Patient presents with   Annual Exam    HPI [name] presents for physical exam  Generally very healthy.   Positive FH CAD.   Had cor calcium scan last year of 0.   Hx of mild hyperlipidemia Takes no medications  Health maintenance reviewed  - we had discussed Cologuard last year but apparently was never ordered.   -Influenza was given a week ago. -Declines Shingrix vaccine -Declines tetanus vaccine  Family history-mother had lung cancer.  She was a smoker.  She also had history of hypertension and hyperlipidemia.  Father had coronary disease.  He has a brother who is alive and well.  Social history-he is married.  He has 4 children.  Non-smoker.  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  No past medical history on file.  Past Surgical History:  Procedure Laterality Date   KNEE SURGERY      Family History  Problem Relation Age of Onset   Cancer Mother        Lung   Hyperlipidemia Mother    Hypertension Mother    Heart disease Father     Social History   Socioeconomic History   Marital status: Married    Spouse name: Not on file   Number of children: Not on file   Years of education: Not on file   Highest education level: Not on file  Occupational History   Not on file  Tobacco Use   Smoking status: Never   Smokeless tobacco: Never  Substance and Sexual Activity   Alcohol use: No   Drug use: No   Sexual activity: Yes  Other Topics Concern   Not on file  Social History Narrative   Not on file   Social Determinants of Health   Financial Resource Strain: Not on file  Food Insecurity: Not on file  Transportation Needs: Not on file  Physical Activity: Not on file  Stress: Not on file  Social Connections: Not on file  Intimate Partner Violence: Not on file    No  outpatient medications prior to visit.   No facility-administered medications prior to visit.    No Known Allergies  ROS Review of Systems  Constitutional:  Negative for activity change, appetite change, fatigue and fever.  HENT:  Negative for congestion, ear pain and trouble swallowing.   Eyes:  Negative for pain and visual disturbance.  Respiratory:  Negative for cough, shortness of breath and wheezing.   Cardiovascular:  Negative for chest pain and palpitations.  Gastrointestinal:  Negative for abdominal distention, abdominal pain, blood in stool, constipation, diarrhea, nausea, rectal pain and vomiting.  Endocrine: Negative for polydipsia and polyuria.  Genitourinary:  Negative for dysuria, hematuria and testicular pain.  Musculoskeletal:  Negative for arthralgias and joint swelling.  Skin:  Negative for rash.  Neurological:  Negative for dizziness, syncope and headaches.  Hematological:  Negative for adenopathy.  Psychiatric/Behavioral:  Negative for confusion and dysphoric mood.      Objective:    Physical Exam Constitutional:      General: He is not in acute distress.    Appearance: Normal appearance. He is well-developed.  HENT:     Head: Normocephalic and atraumatic.     Right Ear: External ear normal.     Left Ear: External ear normal.  Eyes:     Conjunctiva/sclera: Conjunctivae normal.     Pupils: Pupils are equal, round, and reactive to light.  Neck:     Thyroid: No thyromegaly.  Cardiovascular:     Rate and Rhythm: Normal rate and regular rhythm.     Heart sounds: Normal heart sounds. No murmur heard. Pulmonary:     Effort: No respiratory distress.     Breath sounds: No wheezing or rales.  Abdominal:     General: Bowel sounds are normal. There is no distension.     Palpations: Abdomen is soft. There is no mass.     Tenderness: There is no abdominal tenderness. There is no guarding or rebound.  Musculoskeletal:     Cervical back: Normal range of motion  and neck supple.     Right lower leg: No edema.     Left lower leg: No edema.  Lymphadenopathy:     Cervical: No cervical adenopathy.  Skin:    Findings: No rash.  Neurological:     Mental Status: He is alert and oriented to person, place, and time.     Cranial Nerves: No cranial nerve deficit.    BP 124/70 (BP Location: Left Arm, Patient Position: Sitting, Cuff Size: Normal)   Pulse 75   Temp 98 F (36.7 C) (Oral)   Wt 176 lb 3.2 oz (79.9 kg)   SpO2 98%   BMI 25.28 kg/m  Wt Readings from Last 3 Encounters:  04/09/21 176 lb 3.2 oz (79.9 kg)  07/25/20 178 lb 3.2 oz (80.8 kg)  12/01/19 170 lb 4.8 oz (77.2 kg)     Health Maintenance Due  Topic Date Due   HIV Screening  Never done   COLONOSCOPY (Pts 45-11yrs Insurance coverage will need to be confirmed)  Never done    There are no preventive care reminders to display for this patient.  Lab Results  Component Value Date   TSH 3.11 12/01/2019   Lab Results  Component Value Date   WBC 6.6 12/01/2019   HGB 15.3 12/01/2019   HCT 43.6 12/01/2019   MCV 87.0 12/01/2019   PLT 261.0 12/01/2019   Lab Results  Component Value Date   NA 137 12/01/2019   K 4.0 12/01/2019   CO2 26 12/01/2019   GLUCOSE 79 12/01/2019   BUN 15 12/01/2019   CREATININE 0.91 12/01/2019   BILITOT 0.7 12/01/2019   ALKPHOS 58 12/01/2019   AST 19 12/01/2019   ALT 18 12/01/2019   PROT 6.5 12/01/2019   ALBUMIN 4.5 12/01/2019   CALCIUM 9.4 12/01/2019   GFR 84.64 12/01/2019   Lab Results  Component Value Date   CHOL 190 12/01/2019   Lab Results  Component Value Date   HDL 34.30 (L) 12/01/2019   Lab Results  Component Value Date   LDLCALC 132 (H) 12/01/2019   Lab Results  Component Value Date   TRIG 121.0 12/01/2019   Lab Results  Component Value Date   CHOLHDL 6 12/01/2019   No results found for: HGBA1C    Assessment & Plan:   Problem List Items Addressed This Visit   None Visit Diagnoses     Physical exam    -  Primary    Relevant Orders   Basic metabolic panel   Lipid panel   CBC with Differential/Platelet   TSH   Hepatic function panel   PSA   Colon cancer screening       Relevant Orders   Ambulatory referral to Gastroenterology  Discussed colon cancer screening.  He agrees to setting up colonoscopy.  Order was placed.  Obtain screening labs as above  Offered flu vaccine and Shingrix and he declines.  Flu vaccine already given.  No orders of the defined types were placed in this encounter.   Follow-up: No follow-ups on file.    Carolann Littler, MD

## 2021-04-10 LAB — CBC WITH DIFFERENTIAL/PLATELET
Basophils Absolute: 0 10*3/uL (ref 0.0–0.1)
Basophils Relative: 1 % (ref 0.0–3.0)
Eosinophils Absolute: 0.1 10*3/uL (ref 0.0–0.7)
Eosinophils Relative: 2.7 % (ref 0.0–5.0)
HCT: 43.7 % (ref 39.0–52.0)
Hemoglobin: 15.2 g/dL (ref 13.0–17.0)
Lymphocytes Relative: 41.9 % (ref 12.0–46.0)
Lymphs Abs: 1.7 10*3/uL (ref 0.7–4.0)
MCHC: 34.8 g/dL (ref 30.0–36.0)
MCV: 86.7 fl (ref 78.0–100.0)
Monocytes Absolute: 0.5 10*3/uL (ref 0.1–1.0)
Monocytes Relative: 13.4 % — ABNORMAL HIGH (ref 3.0–12.0)
Neutro Abs: 1.6 10*3/uL (ref 1.4–7.7)
Neutrophils Relative %: 41 % — ABNORMAL LOW (ref 43.0–77.0)
Platelets: 175 10*3/uL (ref 150.0–400.0)
RBC: 5.04 Mil/uL (ref 4.22–5.81)
RDW: 12.7 % (ref 11.5–15.5)
WBC: 4 10*3/uL (ref 4.0–10.5)

## 2021-04-10 LAB — BASIC METABOLIC PANEL
BUN: 14 mg/dL (ref 6–23)
CO2: 26 mEq/L (ref 19–32)
Calcium: 8.9 mg/dL (ref 8.4–10.5)
Chloride: 102 mEq/L (ref 96–112)
Creatinine, Ser: 0.93 mg/dL (ref 0.40–1.50)
GFR: 87.97 mL/min (ref >60.00)
Glucose, Bld: 81 mg/dL (ref 70–99)
Potassium: 4.1 mEq/L (ref 3.5–5.1)
Sodium: 136 mEq/L (ref 135–145)

## 2021-04-10 LAB — HEPATIC FUNCTION PANEL
ALT: 28 U/L (ref 0–53)
AST: 23 U/L (ref 0–37)
Albumin: 4.1 g/dL (ref 3.5–5.2)
Alkaline Phosphatase: 49 U/L (ref 39–117)
Bilirubin, Direct: 0.2 mg/dL (ref 0.0–0.3)
Total Bilirubin: 1.1 mg/dL (ref 0.2–1.2)
Total Protein: 6.3 g/dL (ref 6.0–8.3)

## 2021-04-10 LAB — TSH: TSH: 2.54 u[IU]/mL (ref 0.35–5.50)

## 2021-04-10 LAB — LIPID PANEL
Cholesterol: 154 mg/dL (ref 0–200)
HDL: 26.7 mg/dL — ABNORMAL LOW (ref >39.00)
LDL Cholesterol: 104 mg/dL — ABNORMAL HIGH (ref 0–99)
NonHDL: 127.39
Total CHOL/HDL Ratio: 6
Triglycerides: 116 mg/dL (ref 0.0–149.0)
VLDL: 23.2 mg/dL (ref 0.0–40.0)

## 2021-04-10 LAB — PSA: PSA: 1.29 ng/mL (ref 0.10–4.00)

## 2021-04-11 ENCOUNTER — Other Ambulatory Visit (HOSPITAL_COMMUNITY): Payer: Self-pay

## 2021-04-11 DIAGNOSIS — D225 Melanocytic nevi of trunk: Secondary | ICD-10-CM | POA: Diagnosis not present

## 2021-04-11 DIAGNOSIS — D485 Neoplasm of uncertain behavior of skin: Secondary | ICD-10-CM | POA: Diagnosis not present

## 2021-04-11 DIAGNOSIS — L821 Other seborrheic keratosis: Secondary | ICD-10-CM | POA: Diagnosis not present

## 2021-04-11 DIAGNOSIS — L570 Actinic keratosis: Secondary | ICD-10-CM | POA: Diagnosis not present

## 2021-04-11 DIAGNOSIS — C44519 Basal cell carcinoma of skin of other part of trunk: Secondary | ICD-10-CM | POA: Diagnosis not present

## 2021-04-11 DIAGNOSIS — D1801 Hemangioma of skin and subcutaneous tissue: Secondary | ICD-10-CM | POA: Diagnosis not present

## 2021-04-11 MED ORDER — FLUOROURACIL 5 % EX CREA
1.0000 "application " | TOPICAL_CREAM | Freq: Two times a day (BID) | CUTANEOUS | 0 refills | Status: DC
  Filled 2021-04-11: qty 40, 25d supply, fill #0

## 2021-05-01 DIAGNOSIS — C44519 Basal cell carcinoma of skin of other part of trunk: Secondary | ICD-10-CM | POA: Diagnosis not present

## 2021-11-30 IMAGING — CT CT CARDIAC CORONARY ARTERY CALCIUM SCORE
3 series · 14 of 20 positions shown, 15 images · non-contrast
Comparison: None.
COMPARISON: None.

Addendum:
EXAM:
OVER-READ INTERPRETATION  CT CHEST

The following report is an over-read performed by radiologist Dr.
Mytienda Oleas [REDACTED] on 08/17/2020. This over-read
does not include interpretation of cardiac or coronary anatomy or
pathology. The calcium score interpretation by the cardiologist is
attached.
CLINICAL DATA: Risk stratification
Coronary Calcium Score
TECHNIQUE: The patient was scanned on a Siemens Somatom 64 slice scanner. Axial
non-contrast 3 mm slices were carried out through the heart. The
data set was analyzed on a dedicated work station and scored using
the Agatson method.

[Series 2: casc 3.0 bv41 2 bestdiast 73 % · axial · 0.43mm/px · z∈[-241,-160]mm · 4 of 47 slices shown, 5 images]
[im 10/47  vessel]
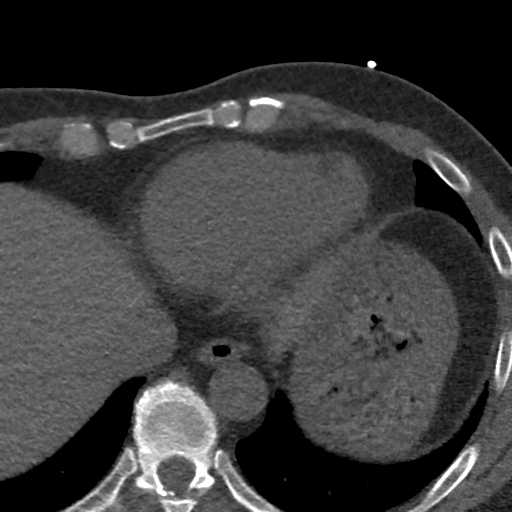
[im 10/47  lung]
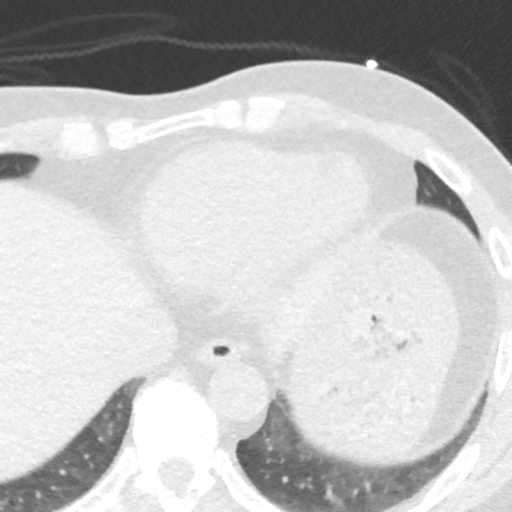
[im 19/47  vessel]
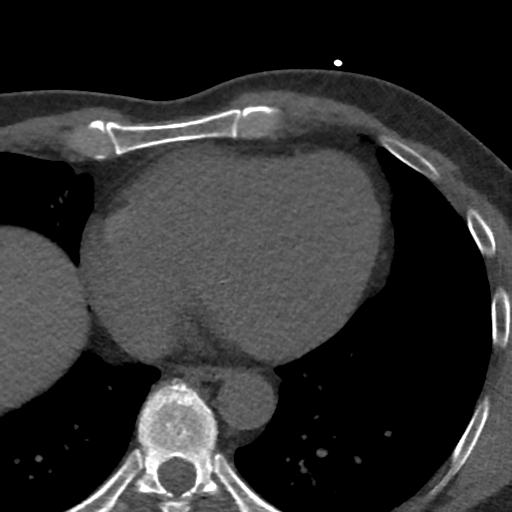
[im 28/47  vessel]
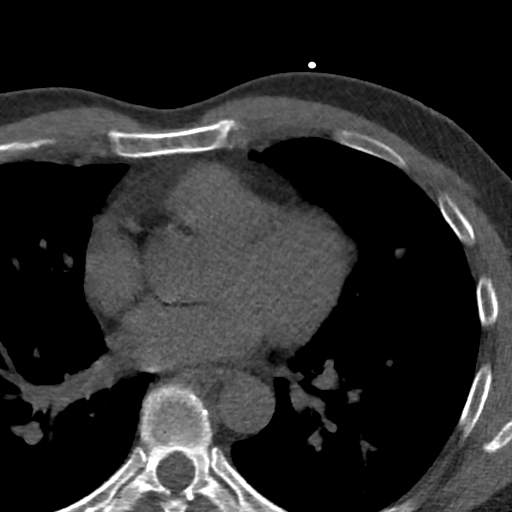
[im 37/47  vessel]
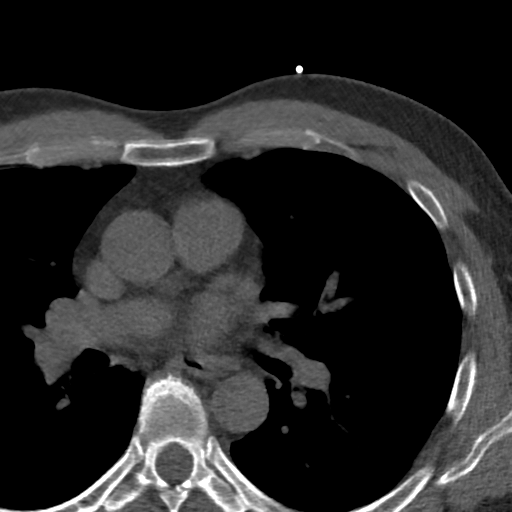

[Series 3: lung 73 % · axial · 0.70mm/px · z∈[-247,-154]mm · 5 of 47 slices shown]
[im 8/47  lung]
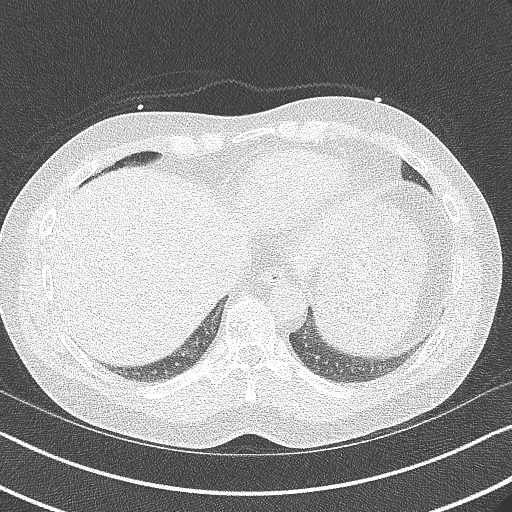
[im 16/47  lung]
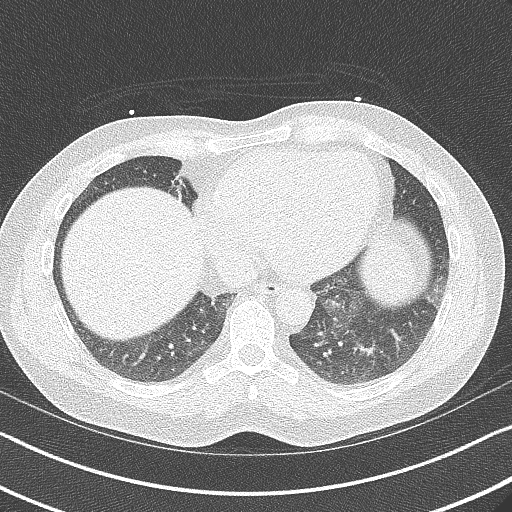
[im 24/47  lung]
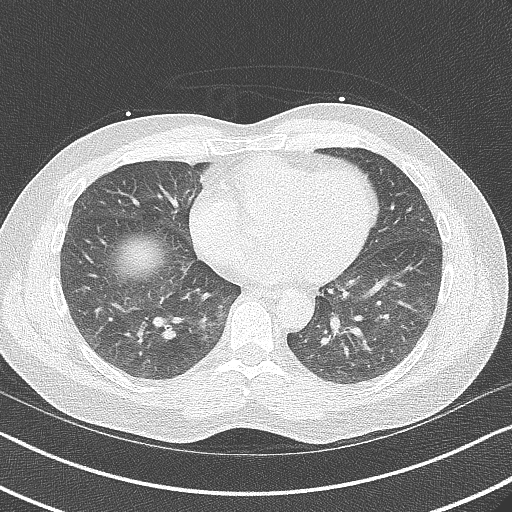
[im 31/47  lung]
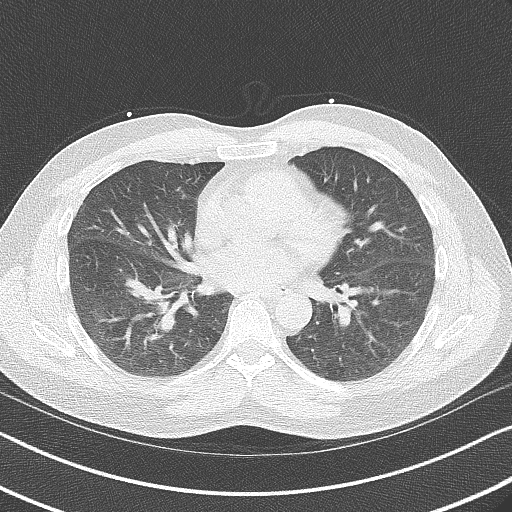
[im 39/47  lung]
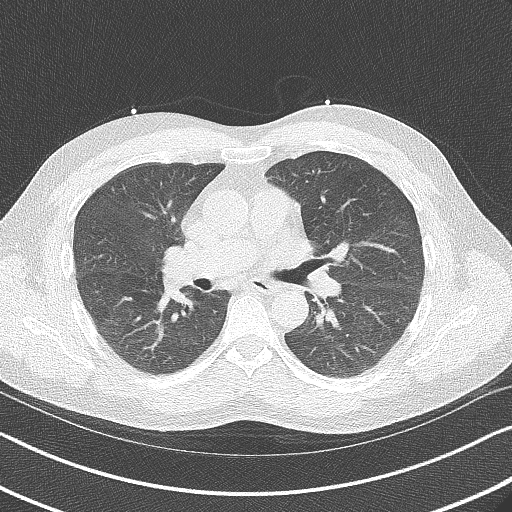

[Series 4: lung st 73 % · axial · 0.70mm/px · z∈[-247,-154]mm · 5 of 47 slices shown]
[im 8/47  lung]
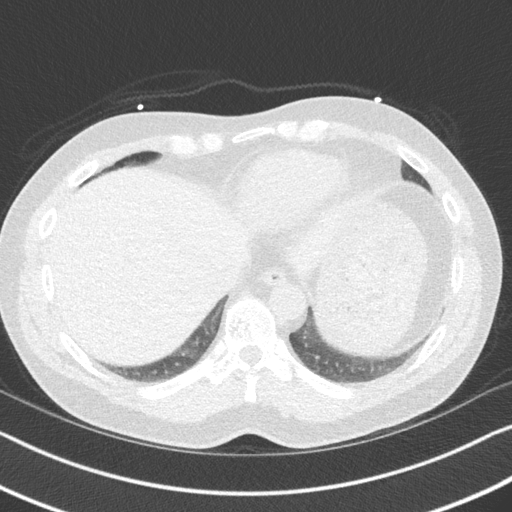
[im 16/47  lung]
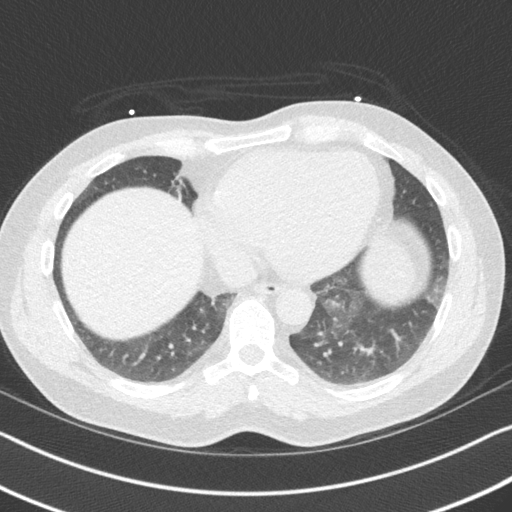
[im 24/47  lung]
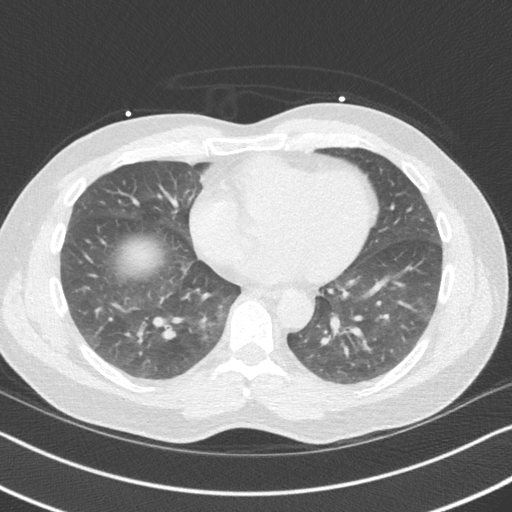
[im 31/47  lung]
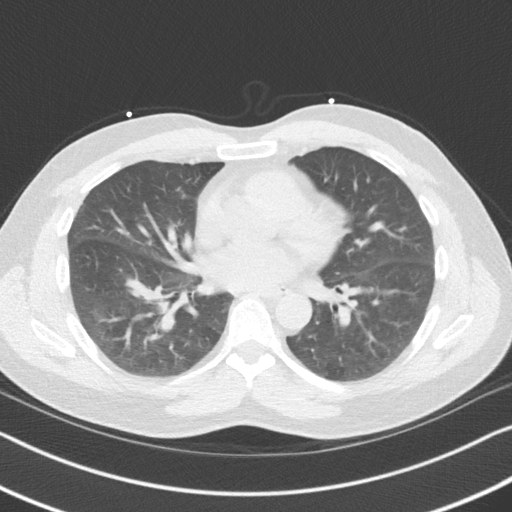
[im 39/47  lung]
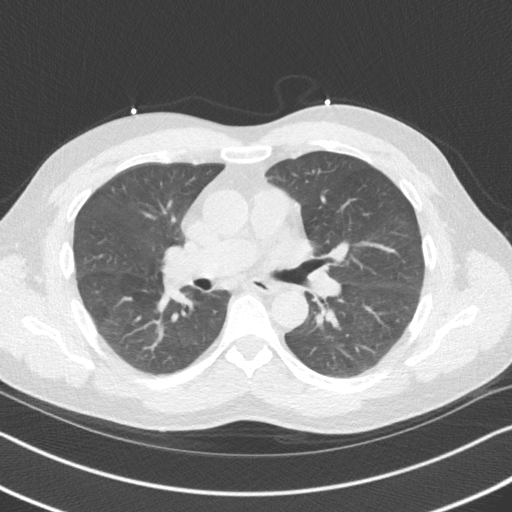

[14 of 20 positions shown; findings below may reference images not displayed]

FINDINGS: Vascular: Normal aortic caliber.

Mediastinum/Nodes: No imaged thoracic adenopathy.

Lungs/Pleura: No pleural fluid.  Clear imaged lungs.

Upper Abdomen: Normal imaged portions of the liver, spleen, stomach.

Musculoskeletal: Mid to lower thoracic vertebral hemangioma.
IMPRESSION: No acute findings in the imaged extracardiac chest.
FINDINGS: Non-cardiac: See separate report from [REDACTED].

Ascending aorta: Normal size

Pericardium: Normal

Coronary arteries: Normal origin
IMPRESSION: Coronary calcium score of 0. This was 0 percentile for age and sex
matched control.

Nidza Olano

*** End of Addendum ***
EXAM:
OVER-READ INTERPRETATION  CT CHEST

The following report is an over-read performed by radiologist Dr.
Mytienda Oleas [REDACTED] on 08/17/2020. This over-read
does not include interpretation of cardiac or coronary anatomy or
pathology. The calcium score interpretation by the cardiologist is
attached.
FINDINGS: Vascular: Normal aortic caliber.

Mediastinum/Nodes: No imaged thoracic adenopathy.

Lungs/Pleura: No pleural fluid.  Clear imaged lungs.

Upper Abdomen: Normal imaged portions of the liver, spleen, stomach.

Musculoskeletal: Mid to lower thoracic vertebral hemangioma.
IMPRESSION: No acute findings in the imaged extracardiac chest.

## 2021-12-05 ENCOUNTER — Other Ambulatory Visit (HOSPITAL_COMMUNITY): Payer: Self-pay

## 2021-12-05 MED ORDER — PEG 3350-KCL-NA BICARB-NACL 420 G PO SOLR
ORAL | 0 refills | Status: DC
  Filled 2021-12-05: qty 4000, 1d supply, fill #0

## 2021-12-07 DIAGNOSIS — K5730 Diverticulosis of large intestine without perforation or abscess without bleeding: Secondary | ICD-10-CM | POA: Diagnosis not present

## 2021-12-07 DIAGNOSIS — Z1211 Encounter for screening for malignant neoplasm of colon: Secondary | ICD-10-CM | POA: Diagnosis not present

## 2021-12-07 LAB — HM COLONOSCOPY

## 2021-12-26 ENCOUNTER — Encounter: Payer: Self-pay | Admitting: Family Medicine

## 2022-01-22 ENCOUNTER — Encounter: Payer: Self-pay | Admitting: Family Medicine

## 2022-01-22 ENCOUNTER — Encounter: Payer: 59 | Admitting: Family Medicine

## 2022-01-22 ENCOUNTER — Ambulatory Visit (INDEPENDENT_AMBULATORY_CARE_PROVIDER_SITE_OTHER): Payer: 59 | Admitting: Family Medicine

## 2022-01-22 VITALS — BP 124/80 | HR 70 | Temp 97.8°F | Ht 68.5 in | Wt 170.6 lb

## 2022-01-22 DIAGNOSIS — Z0000 Encounter for general adult medical examination without abnormal findings: Secondary | ICD-10-CM

## 2022-01-22 DIAGNOSIS — H5203 Hypermetropia, bilateral: Secondary | ICD-10-CM | POA: Diagnosis not present

## 2022-01-22 LAB — HEPATIC FUNCTION PANEL
ALT: 17 U/L (ref 0–53)
AST: 18 U/L (ref 0–37)
Albumin: 4.4 g/dL (ref 3.5–5.2)
Alkaline Phosphatase: 49 U/L (ref 39–117)
Bilirubin, Direct: 0.1 mg/dL (ref 0.0–0.3)
Total Bilirubin: 1 mg/dL (ref 0.2–1.2)
Total Protein: 6.3 g/dL (ref 6.0–8.3)

## 2022-01-22 LAB — CBC WITH DIFFERENTIAL/PLATELET
Basophils Absolute: 0 10*3/uL (ref 0.0–0.1)
Basophils Relative: 0.5 % (ref 0.0–3.0)
Eosinophils Absolute: 0.1 10*3/uL (ref 0.0–0.7)
Eosinophils Relative: 1.6 % (ref 0.0–5.0)
HCT: 45.5 % (ref 39.0–52.0)
Hemoglobin: 15.8 g/dL (ref 13.0–17.0)
Lymphocytes Relative: 18.8 % (ref 12.0–46.0)
Lymphs Abs: 1.3 10*3/uL (ref 0.7–4.0)
MCHC: 34.7 g/dL (ref 30.0–36.0)
MCV: 87.1 fl (ref 78.0–100.0)
Monocytes Absolute: 0.6 10*3/uL (ref 0.1–1.0)
Monocytes Relative: 8.2 % (ref 3.0–12.0)
Neutro Abs: 4.9 10*3/uL (ref 1.4–7.7)
Neutrophils Relative %: 70.9 % (ref 43.0–77.0)
Platelets: 240 10*3/uL (ref 150.0–400.0)
RBC: 5.23 Mil/uL (ref 4.22–5.81)
RDW: 12.8 % (ref 11.5–15.5)
WBC: 6.9 10*3/uL (ref 4.0–10.5)

## 2022-01-22 LAB — LIPID PANEL
Cholesterol: 198 mg/dL (ref 0–200)
HDL: 33.2 mg/dL — ABNORMAL LOW (ref >39.00)
LDL Cholesterol: 138 mg/dL — ABNORMAL HIGH (ref 0–99)
NonHDL: 164.81
Total CHOL/HDL Ratio: 6
Triglycerides: 135 mg/dL (ref 0.0–149.0)
VLDL: 27 mg/dL (ref 0.0–40.0)

## 2022-01-22 LAB — BASIC METABOLIC PANEL
BUN: 13 mg/dL (ref 6–23)
CO2: 27 mEq/L (ref 19–32)
Calcium: 9.2 mg/dL (ref 8.4–10.5)
Chloride: 104 mEq/L (ref 96–112)
Creatinine, Ser: 0.97 mg/dL (ref 0.40–1.50)
GFR: 83.17 mL/min (ref >60.00)
Glucose, Bld: 83 mg/dL (ref 70–99)
Potassium: 4.5 mEq/L (ref 3.5–5.1)
Sodium: 139 mEq/L (ref 135–145)

## 2022-01-22 LAB — VITAMIN D 25 HYDROXY (VIT D DEFICIENCY, FRACTURES): VITD: 25.1 ng/mL — ABNORMAL LOW (ref 30.00–100.00)

## 2022-01-22 LAB — PSA: PSA: 1.29 ng/mL (ref 0.10–4.00)

## 2022-01-22 LAB — TSH: TSH: 2.46 u[IU]/mL (ref 0.35–5.50)

## 2022-01-22 NOTE — Progress Notes (Signed)
Established Patient Office Visit  Subjective   Patient ID: Benjamin Webster, male    DOB: Aug 09, 1960  Age: 61 y.o. MRN: [id number]  Chief Complaint  Patient presents with   Annual Exam    HPI   Benjamin Webster is seen for physical exam.  Generally very healthy.  Takes no regular medications.  Had colonoscopy last year which was normal.  Health maintenance reviewed  -Declines Shingrix -Declines hepatitis C screening -Colonoscopy in July which was normal with recommended follow-up in 10 years -Declines tetanus booster  -Has received yearly flu vaccine but states that he has had flulike symptoms past couple years has gotten this.-No respiratory symptoms but had severe headache and body aches and chills  Family history-mother had lung cancer.  She passed away around age 53.  She was a smoker.  She also had history of hypertension and hyperlipidemia.  Father had coronary disease early to mid 45s.  He has a brother who is alive and well  Social history-he is married and has 4 children-3 daughters and 1 son.  Oldest is 22 and will be evaluating college options soon.  No history of smoking.  No alcohol.  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  Enjoys gardening.  History reviewed. No pertinent past medical history. Past Surgical History:  Procedure Laterality Date   KNEE SURGERY      reports that he has never smoked. He has never used smokeless tobacco. He reports that he does not drink alcohol and does not use drugs. family history includes Cancer in his mother; Heart disease in his father; Hyperlipidemia in his mother; Hypertension in his mother. No Known Allergies  Review of Systems  Constitutional:  Negative for chills, fever and malaise/fatigue.  Eyes:  Negative for blurred vision.  Respiratory:  Negative for shortness of breath.   Cardiovascular:  Negative for chest pain.  Gastrointestinal:  Negative for abdominal pain.  Genitourinary:  Negative for dysuria.   Neurological:  Negative for dizziness, weakness and headaches.  Psychiatric/Behavioral:  Negative for depression.       Objective:     BP 124/80 (BP Location: Left Arm, Patient Position: Sitting, Cuff Size: Normal)   Pulse 70   Temp 97.8 F (36.6 C) (Oral)   Ht 5' 8.5" (1.74 m)   Wt 170 lb 9.6 oz (77.4 kg)   SpO2 98%   BMI 25.56 kg/m    Physical Exam Vitals reviewed.  Constitutional:      General: He is not in acute distress.    Appearance: He is well-developed.  HENT:     Head: Normocephalic and atraumatic.     Right Ear: External ear normal.     Left Ear: External ear normal.     Mouth/Throat:     Mouth: Mucous membranes are moist.     Pharynx: Oropharynx is clear.  Eyes:     Conjunctiva/sclera: Conjunctivae normal.     Pupils: Pupils are equal, round, and reactive to light.  Neck:     Thyroid: No thyromegaly.  Cardiovascular:     Rate and Rhythm: Normal rate and regular rhythm.     Heart sounds: Normal heart sounds. No murmur heard. Pulmonary:     Effort: No respiratory distress.     Breath sounds: No wheezing or rales.  Abdominal:     General: Bowel sounds are normal. There is no distension.     Palpations: Abdomen is soft. There is no mass.     Tenderness:  There is no abdominal tenderness. There is no guarding or rebound.  Musculoskeletal:     Cervical back: Normal range of motion and neck supple.     Right lower leg: No edema.     Left lower leg: No edema.  Lymphadenopathy:     Cervical: No cervical adenopathy.  Skin:    Findings: No rash.  Neurological:     Mental Status: He is alert and oriented to person, place, and time.     Cranial Nerves: No cranial nerve deficit.      No results found for any visits on 01/22/22.    The 10-year ASCVD risk score (Arnett DK, et al., 2019) is: 12.5%    Assessment & Plan:   Problem List Items Addressed This Visit   None Visit Diagnoses     Physical exam    -  Primary   Relevant Orders   Basic  metabolic panel   CBC with Differential/Platelet   Lipid panel   Hepatic function panel   TSH   PSA   VITAMIN D 25 Hydroxy (Vit-D Deficiency, Fractures)     Healthy 61 year old male.  -Colonoscopy unremarkable.  Recommended 10-year follow-up -Patient is considering whether to continue with annual flu vaccine as above -Discussed tetanus and Shingrix and he declines -Obtain screening labs as above.  He is requesting vitamin D level. -Coronary calcium scan 0  3/22  No follow-ups on file.    Carolann Littler, MD

## 2022-03-19 ENCOUNTER — Telehealth: Payer: Self-pay | Admitting: Family Medicine

## 2022-03-19 NOTE — Telephone Encounter (Signed)
Previous intolerance to influenza vaccine past couple years.  He has had fairly severe flulike symptoms with fatigue and severe body aches.  Eulas Post MD [facility] at [facility]

## 2022-05-28 ENCOUNTER — Telehealth: Payer: Self-pay | Admitting: Family Medicine

## 2022-05-28 ENCOUNTER — Ambulatory Visit (INDEPENDENT_AMBULATORY_CARE_PROVIDER_SITE_OTHER): Payer: 59

## 2022-05-28 DIAGNOSIS — Z23 Encounter for immunization: Secondary | ICD-10-CM | POA: Diagnosis not present

## 2022-05-28 NOTE — Telephone Encounter (Signed)
Patient has been scheduled for Tdap

## 2022-05-28 NOTE — Telephone Encounter (Signed)
Stepped on a rusty nail Saturday morning (05/25/22) requesting tetanus shot

## 2022-08-21 DIAGNOSIS — L814 Other melanin hyperpigmentation: Secondary | ICD-10-CM | POA: Diagnosis not present

## 2022-08-21 DIAGNOSIS — L821 Other seborrheic keratosis: Secondary | ICD-10-CM | POA: Diagnosis not present

## 2022-08-21 DIAGNOSIS — B351 Tinea unguium: Secondary | ICD-10-CM | POA: Diagnosis not present

## 2022-08-21 DIAGNOSIS — D1801 Hemangioma of skin and subcutaneous tissue: Secondary | ICD-10-CM | POA: Diagnosis not present

## 2022-08-21 DIAGNOSIS — Z85828 Personal history of other malignant neoplasm of skin: Secondary | ICD-10-CM | POA: Diagnosis not present

## 2022-08-21 DIAGNOSIS — D2271 Melanocytic nevi of right lower limb, including hip: Secondary | ICD-10-CM | POA: Diagnosis not present

## 2022-08-21 DIAGNOSIS — D225 Melanocytic nevi of trunk: Secondary | ICD-10-CM | POA: Diagnosis not present

## 2022-08-21 DIAGNOSIS — D2261 Melanocytic nevi of right upper limb, including shoulder: Secondary | ICD-10-CM | POA: Diagnosis not present

## 2022-08-21 DIAGNOSIS — D2262 Melanocytic nevi of left upper limb, including shoulder: Secondary | ICD-10-CM | POA: Diagnosis not present

## 2022-08-21 DIAGNOSIS — D2272 Melanocytic nevi of left lower limb, including hip: Secondary | ICD-10-CM | POA: Diagnosis not present

## 2023-01-30 ENCOUNTER — Telehealth: Payer: Self-pay | Admitting: Family Medicine

## 2023-01-30 ENCOUNTER — Other Ambulatory Visit (HOSPITAL_COMMUNITY): Payer: Self-pay

## 2023-01-30 MED ORDER — AZITHROMYCIN 250 MG PO TABS
ORAL_TABLET | ORAL | 0 refills | Status: AC
Start: 2023-01-30 — End: 2023-02-04
  Filled 2023-01-30: qty 6, 5d supply, fill #0

## 2023-01-30 NOTE — Telephone Encounter (Signed)
Progressive sinusitis symptoms.    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   Will send in Zithromax for 5 days and follow up by next week if no better  Kristian Covey MD [facility] at [facility]

## 2023-04-03 ENCOUNTER — Telehealth: Payer: Self-pay | Admitting: Family Medicine

## 2023-04-03 NOTE — Telephone Encounter (Signed)
Pt's wife requested form by 4pm today. I informed her that forms can take 5-7 business days and provider is out of the office today.

## 2023-04-03 NOTE — Telephone Encounter (Signed)
Patient dropped off document  Influenza Vaccination Medical Accommodation Request , to be filled out by provider. Patient requested to send it back via Fax within ASAP. Document is located in providers tray at front office.Please advise at Mobile [phone] (mobile)

## 2023-04-04 NOTE — Telephone Encounter (Signed)
Left message for patient to return my call. Forms placed in front office for pickup

## 2023-04-07 NOTE — Telephone Encounter (Signed)
Patient aware forms have been completed and placed in front office for pickup.

## 2023-09-08 DIAGNOSIS — K080 Exfoliation of teeth due to systemic causes: Secondary | ICD-10-CM | POA: Diagnosis not present

## 2023-10-20 ENCOUNTER — Encounter: Payer: Self-pay | Admitting: Family Medicine

## 2023-10-20 ENCOUNTER — Ambulatory Visit (INDEPENDENT_AMBULATORY_CARE_PROVIDER_SITE_OTHER): Admitting: Family Medicine

## 2023-10-20 VITALS — BP 128/74 | HR 83 | Temp 98.2°F | Wt 174.9 lb

## 2023-10-20 DIAGNOSIS — H6122 Impacted cerumen, left ear: Secondary | ICD-10-CM

## 2023-10-20 NOTE — Progress Notes (Signed)
   Established Patient Office Visit  Subjective   Patient ID: Benjamin Webster, male    DOB: 08-10-1960  Age: 63 y.o. MRN: [id number]  Chief Complaint  Patient presents with   Cerumen Impaction    HPI   Benjamin Webster is here for possible cerumen impaction.  Benjamin Webster has had cerumen impactions previously.  Works as a Garment/textile technologist.  Has recently noted some difficulty hearing -especially left side.  No ear pain.  No drainage.  No vertigo symptoms.  History reviewed. No pertinent past medical history. Past Surgical History:  Procedure Laterality Date   KNEE SURGERY      reports that Benjamin Webster has never smoked. Benjamin Webster has never used smokeless tobacco. Benjamin Webster reports that Benjamin Webster does not drink alcohol and does not use drugs. family history includes Cancer in his mother; Heart disease in his father; Hyperlipidemia in his mother; Hypertension in his mother. Allergies  Allergen Reactions   Flublok [Influenza Vaccine Recombinant] Other (See Comments)    Review of Systems  HENT:  Positive for hearing loss. Negative for ear pain and tinnitus.       Objective:     BP 128/74 (BP Location: Left Arm, Patient Position: Sitting, Cuff Size: Normal)   Pulse 83   Temp 98.2 F (36.8 C) (Oral)   Wt 174 lb 14.4 oz (79.3 kg)   SpO2 96%   BMI 26.20 kg/m  BP Readings from Last 3 Encounters:  10/20/23 128/74  01/22/22 124/80  04/09/21 124/70   Wt Readings from Last 3 Encounters:  10/20/23 174 lb 14.4 oz (79.3 kg)  01/22/22 170 lb 9.6 oz (77.4 kg)  04/09/21 176 lb 3.2 oz (79.9 kg)      Physical Exam Vitals reviewed.  Constitutional:      General: Benjamin Webster is not in acute distress.    Appearance: Benjamin Webster is not ill-appearing.  HENT:     Ears:     Comments: Only very minimal cerumen right ear canal.  Benjamin Webster has moderate amount of cerumen left canal. Cardiovascular:     Rate and Rhythm: Normal rate and regular rhythm.  Neurological:     Mental Status: Benjamin Webster is alert.      No results found for any visits on  10/20/23.    The 10-year ASCVD risk score (Arnett DK, et al., 2019) is: 15.9%    Assessment & Plan:   Cerumen left ear canal greater than right.  We discussed removal with curette.  Patient aware of risk including pain and low risk of bleeding.  Consented to curette use.  We were able to remove a small amount of cerumen right canal with curette.  Left side was removed without difficulty with combination of curette and alligator forceps.  Large plug of cerumen removed in entirety.  Could hear better afterwards.  Set up CPE at some point this year  Glean Lamy, MD
